# Patient Record
Sex: Female | Born: 1980 | Race: Asian | Hispanic: No | Marital: Married | Smoking: Current every day smoker
Health system: Southern US, Community
[De-identification: ages and names within clinical notes are randomized; demographics above are authoritative.]

## PROBLEM LIST (undated history)

## (undated) DIAGNOSIS — G40909 Epilepsy, unspecified, not intractable, without status epilepticus: Secondary | ICD-10-CM

## (undated) DIAGNOSIS — R569 Unspecified convulsions: Secondary | ICD-10-CM

## (undated) HISTORY — PX: BRAIN BIOPSY: SHX905

---

## 2015-08-19 DIAGNOSIS — L941 Linear scleroderma: Secondary | ICD-10-CM

## 2015-08-19 DIAGNOSIS — G40209 Localization-related (focal) (partial) symptomatic epilepsy and epileptic syndromes with complex partial seizures, not intractable, without status epilepticus: Secondary | ICD-10-CM | POA: Diagnosis present

## 2016-02-01 DIAGNOSIS — D1800 Hemangioma unspecified site: Secondary | ICD-10-CM | POA: Insufficient documentation

## 2019-12-20 ENCOUNTER — Other Ambulatory Visit: Payer: Self-pay

## 2019-12-20 ENCOUNTER — Inpatient Hospital Stay
Admission: EM | Admit: 2019-12-20 | Discharge: 2019-12-25 | DRG: 871 | Disposition: A | Discharge status: Home / Self Care | Payer: 59 | Attending: Internal Medicine | Admitting: Internal Medicine

## 2019-12-20 ENCOUNTER — Emergency Department: Payer: 59

## 2019-12-20 DIAGNOSIS — E669 Obesity, unspecified: Secondary | ICD-10-CM | POA: Diagnosis present

## 2019-12-20 DIAGNOSIS — A4189 Other specified sepsis: Principal | ICD-10-CM | POA: Diagnosis present

## 2019-12-20 DIAGNOSIS — U071 COVID-19: Secondary | ICD-10-CM

## 2019-12-20 DIAGNOSIS — A419 Sepsis, unspecified organism: Secondary | ICD-10-CM | POA: Diagnosis not present

## 2019-12-20 DIAGNOSIS — Z87820 Personal history of traumatic brain injury: Secondary | ICD-10-CM

## 2019-12-20 DIAGNOSIS — Z6831 Body mass index (BMI) 31.0-31.9, adult: Secondary | ICD-10-CM

## 2019-12-20 DIAGNOSIS — G40209 Localization-related (focal) (partial) symptomatic epilepsy and epileptic syndromes with complex partial seizures, not intractable, without status epilepticus: Secondary | ICD-10-CM | POA: Diagnosis present

## 2019-12-20 DIAGNOSIS — J1282 Pneumonia due to coronavirus disease 2019: Secondary | ICD-10-CM | POA: Diagnosis present

## 2019-12-20 DIAGNOSIS — D649 Anemia, unspecified: Secondary | ICD-10-CM | POA: Diagnosis present

## 2019-12-20 DIAGNOSIS — J9601 Acute respiratory failure with hypoxia: Secondary | ICD-10-CM

## 2019-12-20 DIAGNOSIS — E875 Hyperkalemia: Secondary | ICD-10-CM | POA: Diagnosis present

## 2019-12-20 DIAGNOSIS — L941 Linear scleroderma: Secondary | ICD-10-CM

## 2019-12-20 DIAGNOSIS — R1310 Dysphagia, unspecified: Secondary | ICD-10-CM

## 2019-12-20 DIAGNOSIS — J029 Acute pharyngitis, unspecified: Secondary | ICD-10-CM

## 2019-12-20 DIAGNOSIS — Z79899 Other long term (current) drug therapy: Secondary | ICD-10-CM

## 2019-12-20 DIAGNOSIS — E876 Hypokalemia: Secondary | ICD-10-CM | POA: Diagnosis not present

## 2019-12-20 HISTORY — DX: Epilepsy, unspecified, not intractable, without status epilepticus: G40.909

## 2019-12-20 LAB — CBC
HCT: 41.5 % (ref 36.0–46.0)
Hemoglobin: 12.4 g/dL (ref 12.0–15.0)
MCH: 26.7 pg (ref 26.0–34.0)
MCHC: 29.9 g/dL — ABNORMAL LOW (ref 30.0–36.0)
MCV: 89.4 fL (ref 80.0–100.0)
Platelets: 222 10*3/uL (ref 150–400)
RBC: 4.64 MIL/uL (ref 3.87–5.11)
RDW: 16.1 % — ABNORMAL HIGH (ref 11.5–15.5)
WBC: 16.5 10*3/uL — ABNORMAL HIGH (ref 4.0–10.5)
nRBC: 0 % (ref 0.0–0.2)

## 2019-12-20 MED ORDER — SODIUM CHLORIDE 0.9 % IV SOLN
500.0000 mg | Freq: Once | INTRAVENOUS | Status: AC
  Administered 2019-12-21: 01:00:00 500 mg via INTRAVENOUS
  Filled 2019-12-20: qty 500

## 2019-12-20 MED ORDER — SODIUM CHLORIDE 0.9 % IV SOLN
1.0000 g | Freq: Once | INTRAVENOUS | Status: AC
  Administered 2019-12-21: 01:00:00 1 g via INTRAVENOUS
  Filled 2019-12-20: qty 10

## 2019-12-20 MED ORDER — ACETAMINOPHEN 160 MG/5ML PO SOLN
500.0000 mg | Freq: Once | ORAL | Status: AC
  Filled 2019-12-20: qty 20.3

## 2019-12-20 MED ORDER — SODIUM CHLORIDE 0.9 % IV BOLUS
1000.0000 mL | Freq: Once | INTRAVENOUS | Status: AC
  Administered 2019-12-21: 1000 mL via INTRAVENOUS

## 2019-12-20 MED ORDER — ONDANSETRON HCL 4 MG/2ML IJ SOLN
4.0000 mg | Freq: Once | INTRAMUSCULAR | Status: AC
  Administered 2019-12-21: 4 mg via INTRAVENOUS
  Filled 2019-12-20: qty 2

## 2019-12-20 MED ORDER — KETOROLAC TROMETHAMINE 30 MG/ML IJ SOLN
30.0000 mg | Freq: Once | INTRAMUSCULAR | Status: AC
  Administered 2019-12-21: 30 mg via INTRAVENOUS
  Filled 2019-12-20: qty 1

## 2019-12-20 MED ORDER — ACETAMINOPHEN 160 MG/5ML PO SUSP
ORAL | Status: AC
  Administered 2019-12-20: 500 mg via ORAL
  Filled 2019-12-20: qty 20

## 2019-12-20 NOTE — ED Provider Notes (Addendum)
[facility] Emergency Department Provider Note  ____________________________________________   First MD Initiated Contact with Patient 12/20/19 2319     (approximate)  I have reviewed the triage vital signs and the nursing notes.   HISTORY  Chief Complaint Emesis and Sore Throat   HPI Joanna Nelson is a 39 y.o. female with history of TBI presents to the emergency department with a 1 day history of sore throat current pain score 10 out of 10 with associated generalized body aches, nonproductive cough poor p.o. intake subjective fevers       Past Medical History:  Diagnosis Date  . Epilepsy [facility])     Patient Active Problem List   Diagnosis Date Noted  . Pneumonia due to COVID-19 virus 12/21/2019  . Acute respiratory failure with hypoxia ([place]) 12/21/2019  . Acute pharyngitis 12/21/2019  . Sepsis ([place]) 12/21/2019  . Dysphagia 12/21/2019  . 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 Authorizing Provider  cloBAZam (ONFI) 10 MG tablet Take 30 mg by mouth at bedtime. 08/24/19  Yes [provider]  divalproex (DEPAKOTE ER) 500 MG 24 hr tablet Take 500 mg by mouth at bedtime. 11/25/19  Yes [provider]  lamoTRIgine (LAMICTAL) 150 MG tablet Take 150 mg by mouth daily. 12/12/19  Yes [provider]    Allergies Patient has no known allergies.  No family history on file.  Social History Social History   Tobacco Use  . Smoking status: Not on file  Substance Use Topics  . Alcohol use: Not on file  . Drug use: Not on file    Review of Systems Constitutional: No fever/chills Eyes: No visual changes. ENT: Positive for sore throat Cardiovascular: Denies chest pain. Respiratory: Denies shortness of breath.  Positive for cough Gastrointestinal: No abdominal pain.  No  nausea, no vomiting.  No diarrhea.  No constipation. Genitourinary: Negative for dysuria. Musculoskeletal: Negative for neck pain.  Negative for back pain.  Positive for generalized muscle ache Integumentary: Negative for rash. Neurological: Negative for headaches, focal weakness or numbness.   ____________________________________________   PHYSICAL EXAM:  VITAL SIGNS: ED Triage Vitals  Enc Vitals Group     BP 12/20/19 2247 123/75     Pulse Rate 12/20/19 2247 (!) 125     Resp 12/20/19 2247 20     Temp 12/20/19 2247 (!) 103.2 F (39.6 C)     Temp Source 12/20/19 2247 Oral     SpO2 12/20/19 2247 95 %     Weight 12/20/19 2248 72.6 kg (160 lb)     Height 12/20/19 2248 1.575 m (5\' 2" )     Head Circumference --      Peak Flow --      Pain Score --      Pain Loc --      Pain Edu? --      Excl. in [place]? --     Constitutional: Alert and oriented.  Eyes: Conjunctivae are normal.  Head: Atraumatic. Nose: No congestion/rhinnorhea. Mouth/Throat: Patient is wearing a mask. Neck: No stridor.  No meningeal signs.   Cardiovascular: Normal rate, regular rhythm. Good peripheral circulation. Grossly normal heart sounds. Respiratory: Normal respiratory effort.  No retractions. Gastrointestinal: Soft and nontender. No distention.  Musculoskeletal: No lower extremity tenderness nor edema. No gross deformities of extremities. Neurologic:  Normal speech and language. No  gross focal neurologic deficits are appreciated.  Skin:  Skin is warm, dry and intact. Psychiatric: Mood and affect are normal. Speech and behavior are normal.  ____________________________________________   LABS (all labs ordered are listed, but only abnormal results are displayed)  Labs Reviewed  CBC - Abnormal; Notable for the following components:      Result Value   WBC 16.5 (*)    MCHC 29.9 (*)    RDW 16.1 (*)    All other components within normal limits  COMPREHENSIVE METABOLIC PANEL - Abnormal; Notable for the  following components:   Potassium 5.4 (*)    Glucose, Bld 119 (*)    Calcium 8.3 (*)    Total Protein 8.2 (*)    All other components within normal limits  URINALYSIS, COMPLETE (UACMP) WITH MICROSCOPIC - Abnormal; Notable for the following components:   Color, Urine YELLOW (*)    APPearance HAZY (*)    Glucose, UA 50 (*)    Hgb urine dipstick SMALL (*)    Ketones, ur 20 (*)    Bacteria, UA RARE (*)    All other components within normal limits  CBC - Abnormal; Notable for the following components:   Hemoglobin 8.2 (*)    HCT 30.2 (*)    MCV 67.3 (*)    MCH 18.3 (*)    MCHC 27.2 (*)    RDW 19.0 (*)    All other components within normal limits  CBC - Abnormal; Notable for the following components:   RBC 3.85 (*)    Hemoglobin 7.0 (*)    HCT 26.4 (*)    MCV 68.6 (*)    MCH 18.2 (*)    MCHC 26.5 (*)    RDW 19.1 (*)    All other components within normal limits  C-REACTIVE PROTEIN - Abnormal; Notable for the following components:   CRP 6.5 (*)    All other components within normal limits  FOLATE - Abnormal; Notable for the following components:   Folate 5.5 (*)    All other components within normal limits  POC SARS CORONAVIRUS 2 AG - Abnormal; Notable for the following components:   SARS Coronavirus 2 Ag POSITIVE (*)    All other components within normal limits  GROUP A STREP BY PCR  CULTURE, BLOOD (ROUTINE X 2)  CULTURE, BLOOD (ROUTINE X 2)  URINE CULTURE  EXPECTORATED SPUTUM ASSESSMENT W REFEX TO RESP CULTURE  LACTIC ACID, PLASMA  LACTIC ACID, PLASMA  PROTIME-INR  PREGNANCY, URINE  HIV ANTIBODY (ROUTINE TESTING W REFLEX)  PROTIME-INR  CORTISOL-AM, BLOOD  PROCALCITONIN  FIBRIN DERIVATIVES D-DIMER ([facility] ONLY)  IRON AND TIBC  VITAMIN B12  OCCULT BLOOD X 1 CARD TO LAB, STOOL  CBC WITH DIFFERENTIAL/PLATELET  COMPREHENSIVE METABOLIC PANEL  C-REACTIVE PROTEIN  FIBRIN DERIVATIVES D-DIMER ([facility] ONLY)  POC SARS CORONAVIRUS 2 AG -  ED  ABO/RH  TYPE AND SCREEN  PREPARE  RBC (CROSSMATCH)   ED ECG REPORT I, [place] Zilphia Kozinski, the attending physician, personally viewed and interpreted this ECG.   Date: 12/21/2019  EKG Time: 12:28 AM  Rate: 117  Rhythm: Sinus tachycardia  Axis: Normal  Intervals: Normal  ST&T Change: Diffuse T wave inversions   RADIOLOGY I, [place] Tyonna Talerico, personally viewed and evaluated these images (plain radiographs) as part of my medical decision making, as well as reviewing the written report by the radiologist.  ED MD interpretation:    Official radiology report(s): CT Soft Tissue Neck W Contrast  Result Date: 12/21/2019  CLINICAL DATA:  Vomiting and sore throat EXAM: CT NECK WITH CONTRAST TECHNIQUE: Multidetector CT imaging of the neck was performed using the standard protocol following the bolus administration of intravenous contrast. CONTRAST:  21mL OMNIPAQUE IOHEXOL 300 MG/ML  SOLN COMPARISON:  None. FINDINGS: Pharynx and larynx: --Nasopharynx: Fossae of [place] are clear. Normal adenoid tonsils for age. --Oral cavity and oropharynx: No visible lesion of the tongue or floor of mouth. Normal lingual and palatine tonsils. Oropharynx is clear. --Hypopharynx: Normal vallecula and pyriform sinuses. --Larynx: Normal epiglottis and pre-epiglottic space. Normal aryepiglottic and vocal folds. --Retropharyngeal space: No abscess, effusion or lymphadenopathy. Salivary glands: No right parotid gland is visualized. There is compensatory hypertrophy of the left parotid gland. Diminutive right submandibular gland. Thyroid: Normal. Lymph nodes: None enlarged or abnormal density. Vascular: Negative. Limited intracranial: Negative. Visualized orbits: Asymmetric but otherwise normal Mastoids and visualized paranasal sinuses: Clear. Skeleton: No acute or aggressive process. Upper chest: Negative. Other: Facial asymmetry with atrophy of the right 10 paralysis and masseter muscles. IMPRESSION: 1. No acute abnormality of the neck. 2. 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 3. Facial asymmetry with atrophy of the right temporalis and masseter muscles. Diminutive right orbit. Electronically Signed   By: Ulyses Jarred M.D.   On: 12/21/2019 02:15     .Critical Care Performed by: Gregor Hams, MD Authorized by: Gregor Hams, MD   Critical care provider statement:    Critical care time (minutes):  30   Critical care time was exclusive of:  Separately billable procedures and treating other patients   Critical care was necessary to treat or prevent imminent or life-threatening deterioration of the following conditions:  Respiratory failure and sepsis   Critical care was time spent personally by me on the following activities:  Development of treatment plan with patient or surrogate, discussions with consultants, evaluation of patient's response to treatment, examination of patient, obtaining history from patient or surrogate, ordering and performing treatments and interventions, ordering and review of laboratory studies, ordering and review of radiographic studies, pulse oximetry, re-evaluation of patient's condition and review of old charts     ____________________________________________   INITIAL IMPRESSION / MDM / [place] / ED COURSE  As part of my medical decision making, I reviewed the following data within the electronic MEDICAL RECORD NUMBER   38 year old female presented with above-stated history and physical exam differential diagnosis including but not limited to sepsis, COVID-19.  As such sepsis protocol was initiated given concern for COVID-19 pneumonia and possibly superimposed bacterial pneumonia IV ceftriaxone and azithromycin was administered.  Patient noted to be COVID-19 here in the emergency department and as such Decadron and remdesivir ordered.  Patient with hypoxia with very limited activity with oxygen saturations decreasing to 88.  As such patient discussed  with hospitalist after hospital admission for further evaluation and management.      ____________________________________________  FINAL CLINICAL IMPRESSION(S) / ED DIAGNOSES  Final diagnoses:  Sepsis, due to unspecified organism, unspecified whether acute organ dysfunction present ([place])  COVID-19     MEDICATIONS GIVEN DURING THIS VISIT:  Medications  enoxaparin (LOVENOX) injection 40 mg (40 mg Subcutaneous Not Given 12/21/19 0844)  cefTRIAXone (ROCEPHIN) 2 g in sodium chloride 0.9 % 100 mL IVPB (2 g Intravenous New Bag/Given 12/21/19 2109)  azithromycin (ZITHROMAX) 500 mg in sodium chloride 0.9 % 250 mL IVPB (500 mg Intravenous New Bag/Given 12/21/19 2135)  acetaminophen (TYLENOL) tablet 650 mg (has no  administration in time range)    Or  acetaminophen (TYLENOL) suppository 650 mg (has no administration in time range)  ondansetron (ZOFRAN) tablet 4 mg (has no administration in time range)    Or  ondansetron (ZOFRAN) injection 4 mg (has no administration in time range)  remdesivir 200 mg in sodium chloride 0.9% 250 mL IVPB (200 mg Intravenous New Bag/Given 12/21/19 0410)    Followed by  remdesivir 100 mg in sodium chloride 0.9 % 100 mL IVPB (has no administration in time range)  albuterol (VENTOLIN HFA) 108 (90 Base) MCG/ACT inhaler 2 puff (2 puffs Inhalation Given 12/21/19 1925)  dexamethasone (DECADRON) injection 6 mg (6 mg Intravenous Given 12/21/19 1732)  guaiFENesin-dextromethorphan (ROBITUSSIN DM) 100-10 MG/5ML syrup 10 mL (has no administration in time range)  chlorpheniramine-HYDROcodone (TUSSIONEX) 10-8 MG/5ML suspension 5 mL (5 mLs Oral Given 12/21/19 0405)  ascorbic acid (VITAMIN C) tablet 500 mg (500 mg Oral Given 12/21/19 0950)  zinc sulfate capsule 220 mg (220 mg Oral Given 12/21/19 0951)  multivitamin with minerals tablet 1 tablet (1 tablet Oral Given 12/21/19 0950)  menthol-cetylpyridinium (CEPACOL) lozenge 3 mg (has no administration in time range)  phenol (CHLORASEPTIC)  mouth spray 1 spray (1 spray Mouth/Throat Given 12/21/19 1329)  pantoprazole (PROTONIX) injection 40 mg (40 mg Intravenous Given 12/21/19 2108)  0.9 %  sodium chloride infusion ( Intravenous New Bag/Given 12/21/19 1736)  ketorolac (TORADOL) 30 MG/ML injection 30 mg (30 mg Intravenous Given 12/21/19 0049)  sodium chloride 0.9 % bolus 1,000 mL (0 mLs Intravenous Stopped 12/21/19 0257)  sodium chloride 0.9 % bolus 1,000 mL (1,000 mLs Intravenous Transfusing/Transfer 12/21/19 0341)  ondansetron (ZOFRAN) injection 4 mg (4 mg Intravenous Given 12/21/19 0049)  acetaminophen (TYLENOL) 160 MG/5ML solution 500 mg (500 mg Oral Given 12/20/19 2356)  cefTRIAXone (ROCEPHIN) 1 g in sodium chloride 0.9 % 100 mL IVPB (0 g Intravenous Stopped 12/21/19 0222)  azithromycin (ZITHROMAX) 500 mg in sodium chloride 0.9 % 250 mL IVPB (0 mg Intravenous Stopped 12/21/19 0222)  dexamethasone (DECADRON) injection 10 mg (10 mg Intravenous Given 12/21/19 0118)  iohexol (OMNIPAQUE) 300 MG/ML solution 75 mL (75 mLs Intravenous Contrast Given 12/21/19 0151)  0.9 %  sodium chloride infusion (Manually program via Guardrails IV Fluids) ( Intravenous New Bag/Given 12/21/19 0851)     ED Discharge Orders    None      *Please note:  Joanna Nelson was evaluated in Emergency Department on 12/22/2019 for the symptoms described in the history of present illness. She was evaluated in the context of the global COVID-19 pandemic, which necessitated consideration that the patient might be at risk for infection with the SARS-CoV-2 virus that causes COVID-19. Institutional protocols and algorithms that pertain to the evaluation of patients at risk for COVID-19 are in a state of rapid change based on information released by regulatory bodies including the CDC and federal and state organizations. These policies and algorithms were followed during the patient's care in the ED.  Some ED evaluations and interventions may be delayed as a result of limited staffing  during the pandemic.*  Note:  This document was prepared using Dragon voice recognition software and may include unintentional dictation errors.   Gregor Hams, MD 12/22/19 [id number]    Gregor Hams, MD 01/02/20 2236

## 2019-12-20 NOTE — ED Triage Notes (Signed)
Patient is non verbal.  Patient wrote note saying unable to eat for 2 days due to vomiting and having sore throat.  Patient with very congested cough noted during triage.

## 2019-12-21 ENCOUNTER — Emergency Department: Payer: 59

## 2019-12-21 ENCOUNTER — Encounter: Payer: Self-pay | Admitting: Radiology

## 2019-12-21 DIAGNOSIS — D649 Anemia, unspecified: Secondary | ICD-10-CM | POA: Diagnosis present

## 2019-12-21 DIAGNOSIS — J1282 Pneumonia due to coronavirus disease 2019: Secondary | ICD-10-CM | POA: Diagnosis present

## 2019-12-21 DIAGNOSIS — D509 Iron deficiency anemia, unspecified: Secondary | ICD-10-CM | POA: Diagnosis not present

## 2019-12-21 DIAGNOSIS — Z87820 Personal history of traumatic brain injury: Secondary | ICD-10-CM | POA: Diagnosis not present

## 2019-12-21 DIAGNOSIS — A419 Sepsis, unspecified organism: Secondary | ICD-10-CM

## 2019-12-21 DIAGNOSIS — Z6831 Body mass index (BMI) 31.0-31.9, adult: Secondary | ICD-10-CM | POA: Diagnosis not present

## 2019-12-21 DIAGNOSIS — E875 Hyperkalemia: Secondary | ICD-10-CM | POA: Diagnosis present

## 2019-12-21 DIAGNOSIS — J029 Acute pharyngitis, unspecified: Secondary | ICD-10-CM | POA: Diagnosis present

## 2019-12-21 DIAGNOSIS — Z79899 Other long term (current) drug therapy: Secondary | ICD-10-CM | POA: Diagnosis not present

## 2019-12-21 DIAGNOSIS — E876 Hypokalemia: Secondary | ICD-10-CM | POA: Diagnosis not present

## 2019-12-21 DIAGNOSIS — R1310 Dysphagia, unspecified: Secondary | ICD-10-CM | POA: Diagnosis present

## 2019-12-21 DIAGNOSIS — E669 Obesity, unspecified: Secondary | ICD-10-CM | POA: Diagnosis present

## 2019-12-21 DIAGNOSIS — G40209 Localization-related (focal) (partial) symptomatic epilepsy and epileptic syndromes with complex partial seizures, not intractable, without status epilepticus: Secondary | ICD-10-CM | POA: Diagnosis present

## 2019-12-21 DIAGNOSIS — R6520 Severe sepsis without septic shock: Secondary | ICD-10-CM | POA: Diagnosis not present

## 2019-12-21 DIAGNOSIS — A4189 Other specified sepsis: Secondary | ICD-10-CM | POA: Diagnosis present

## 2019-12-21 DIAGNOSIS — J9601 Acute respiratory failure with hypoxia: Secondary | ICD-10-CM | POA: Diagnosis present

## 2019-12-21 DIAGNOSIS — U071 COVID-19: Secondary | ICD-10-CM | POA: Diagnosis present

## 2019-12-21 LAB — URINALYSIS, COMPLETE (UACMP) WITH MICROSCOPIC
Bilirubin Urine: NEGATIVE
Glucose, UA: 50 mg/dL — AB
Ketones, ur: 20 mg/dL — AB
Leukocytes,Ua: NEGATIVE
Nitrite: NEGATIVE
Protein, ur: NEGATIVE mg/dL
Specific Gravity, Urine: 1.013 (ref 1.005–1.030)
pH: 7 (ref 5.0–8.0)

## 2019-12-21 LAB — CBC
HCT: 30.2 % — ABNORMAL LOW (ref 36.0–46.0)
HCT: 26.4 % — ABNORMAL LOW (ref 36.0–46.0)
Hemoglobin: 8.2 g/dL — ABNORMAL LOW (ref 12.0–15.0)
Hemoglobin: 7 g/dL — ABNORMAL LOW (ref 12.0–15.0)
MCH: 18.3 pg — ABNORMAL LOW (ref 26.0–34.0)
MCH: 18.2 pg — ABNORMAL LOW (ref 26.0–34.0)
MCHC: 27.2 g/dL — ABNORMAL LOW (ref 30.0–36.0)
MCHC: 26.5 g/dL — ABNORMAL LOW (ref 30.0–36.0)
MCV: 67.3 fL — ABNORMAL LOW (ref 80.0–100.0)
MCV: 68.6 fL — ABNORMAL LOW (ref 80.0–100.0)
Platelets: 359 10*3/uL (ref 150–400)
Platelets: 310 10*3/uL (ref 150–400)
RBC: 4.49 MIL/uL (ref 3.87–5.11)
RBC: 3.85 MIL/uL — ABNORMAL LOW (ref 3.87–5.11)
RDW: 19 % — ABNORMAL HIGH (ref 11.5–15.5)
RDW: 19.1 % — ABNORMAL HIGH (ref 11.5–15.5)
WBC: 8.8 10*3/uL (ref 4.0–10.5)
WBC: 8.3 10*3/uL (ref 4.0–10.5)
nRBC: 0 % (ref 0.0–0.2)
nRBC: 0 % (ref 0.0–0.2)

## 2019-12-21 LAB — IRON AND TIBC
Iron: 82 ug/dL (ref 28–170)
Saturation Ratios: 20 % (ref 10.4–31.8)
TIBC: 420 ug/dL (ref 250–450)
UIBC: 338 ug/dL

## 2019-12-21 LAB — COMPREHENSIVE METABOLIC PANEL
ALT: 8 U/L (ref 0–44)
AST: 24 U/L (ref 15–41)
Albumin: 3.5 g/dL (ref 3.5–5.0)
Alkaline Phosphatase: 106 U/L (ref 38–126)
Anion gap: 15 (ref 5–15)
BUN: 7 mg/dL (ref 6–20)
CO2: 23 mmol/L (ref 22–32)
Calcium: 8.3 mg/dL — ABNORMAL LOW (ref 8.9–10.3)
Chloride: 99 mmol/L (ref 98–111)
Creatinine, Ser: 0.87 mg/dL (ref 0.44–1.00)
GFR calc Af Amer: >60 mL/min (ref >60)
GFR calc non Af Amer: >60 mL/min (ref >60)
Glucose, Bld: 119 mg/dL — ABNORMAL HIGH (ref 70–99)
Potassium: 5.4 mmol/L — ABNORMAL HIGH (ref 3.5–5.1)
Sodium: 137 mmol/L (ref 135–145)
Total Bilirubin: 0.9 mg/dL (ref 0.3–1.2)
Total Protein: 8.2 g/dL — ABNORMAL HIGH (ref 6.5–8.1)

## 2019-12-21 LAB — HIV ANTIBODY (ROUTINE TESTING W REFLEX): HIV Screen 4th Generation wRfx: NONREACTIVE

## 2019-12-21 LAB — POC SARS CORONAVIRUS 2 AG: SARS Coronavirus 2 Ag: POSITIVE — AB

## 2019-12-21 LAB — VITAMIN B12: Vitamin B-12: 399 pg/mL (ref 180–914)

## 2019-12-21 LAB — PROTIME-INR
INR: 1 (ref 0.8–1.2)
INR: 1.1 (ref 0.8–1.2)
Prothrombin Time: 13.1 seconds (ref 11.4–15.2)
Prothrombin Time: 14.5 seconds (ref 11.4–15.2)

## 2019-12-21 LAB — ABO/RH: ABO/RH(D): O POS

## 2019-12-21 LAB — GROUP A STREP BY PCR: Group A Strep by PCR: NOT DETECTED

## 2019-12-21 LAB — LACTIC ACID, PLASMA
Lactic Acid, Venous: 1.8 mmol/L (ref 0.5–1.9)
Lactic Acid, Venous: 1.4 mmol/L (ref 0.5–1.9)

## 2019-12-21 LAB — FOLATE: Folate: 5.5 ng/mL — ABNORMAL LOW (ref >5.9)

## 2019-12-21 LAB — PREPARE RBC (CROSSMATCH)

## 2019-12-21 LAB — PROCALCITONIN: Procalcitonin: 0.1 ng/mL

## 2019-12-21 LAB — FIBRIN DERIVATIVES D-DIMER (ARMC ONLY): Fibrin derivatives D-dimer (ARMC): 347.73 ng/mL (FEU) (ref 0.00–499.00)

## 2019-12-21 LAB — CORTISOL-AM, BLOOD: Cortisol - AM: 7.2 ug/dL (ref 6.7–22.6)

## 2019-12-21 LAB — C-REACTIVE PROTEIN: CRP: 6.5 mg/dL — ABNORMAL HIGH (ref <1.0)

## 2019-12-21 LAB — PREGNANCY, URINE: Preg Test, Ur: NEGATIVE

## 2019-12-21 MED ORDER — DEXAMETHASONE SODIUM PHOSPHATE 10 MG/ML IJ SOLN
10.0000 mg | Freq: Once | INTRAMUSCULAR | Status: AC
  Administered 2019-12-21: 10 mg via INTRAVENOUS

## 2019-12-21 MED ORDER — SODIUM CHLORIDE 0.9 % IV SOLN
2.0000 g | INTRAVENOUS | Status: DC
  Administered 2019-12-21: 21:00:00 2 g via INTRAVENOUS
  Filled 2019-12-21: qty 2
  Filled 2019-12-21: qty 20

## 2019-12-21 MED ORDER — PANTOPRAZOLE SODIUM 40 MG IV SOLR
40.0000 mg | Freq: Two times a day (BID) | INTRAVENOUS | Status: DC
  Administered 2019-12-21 – 2019-12-25 (×9): 40 mg via INTRAVENOUS
  Filled 2019-12-21 (×9): qty 40

## 2019-12-21 MED ORDER — ACETAMINOPHEN 325 MG PO TABS: 650.0000 mg | ORAL_TABLET | Freq: Four times a day (QID) | ORAL | Status: DC | PRN

## 2019-12-21 MED ORDER — LACTATED RINGERS IV BOLUS (SEPSIS): 1000.0000 mL | Freq: Once | INTRAVENOUS | Status: DC

## 2019-12-21 MED ORDER — ONDANSETRON HCL 4 MG PO TABS: 4.0000 mg | ORAL_TABLET | Freq: Four times a day (QID) | ORAL | Status: DC | PRN

## 2019-12-21 MED ORDER — SODIUM CHLORIDE 0.9% IV SOLUTION: Freq: Once | INTRAVENOUS | Status: AC

## 2019-12-21 MED ORDER — ADULT MULTIVITAMIN W/MINERALS CH
1.0000 | ORAL_TABLET | Freq: Every day | ORAL | Status: DC
  Administered 2019-12-21 – 2019-12-25 (×5): 1 via ORAL
  Filled 2019-12-21 (×5): qty 1

## 2019-12-21 MED ORDER — ASCORBIC ACID 500 MG PO TABS
500.0000 mg | ORAL_TABLET | Freq: Every day | ORAL | Status: DC
  Administered 2019-12-21 – 2019-12-25 (×5): 500 mg via ORAL
  Filled 2019-12-21 (×5): qty 1

## 2019-12-21 MED ORDER — MENTHOL 3 MG MT LOZG
1.0000 | LOZENGE | OROMUCOSAL | Status: DC | PRN
  Administered 2019-12-22: 3 mg via ORAL
  Filled 2019-12-21 (×2): qty 9

## 2019-12-21 MED ORDER — ONDANSETRON HCL 4 MG/2ML IJ SOLN: 4.0000 mg | Freq: Four times a day (QID) | INTRAMUSCULAR | Status: DC | PRN

## 2019-12-21 MED ORDER — SODIUM CHLORIDE 0.9 % IV SOLN
200.0000 mg | Freq: Once | INTRAVENOUS | Status: AC
  Administered 2019-12-21: 200 mg via INTRAVENOUS
  Filled 2019-12-21: qty 200

## 2019-12-21 MED ORDER — IOHEXOL 300 MG/ML  SOLN
75.0000 mL | Freq: Once | INTRAMUSCULAR | Status: AC | PRN
  Administered 2019-12-21: 75 mL via INTRAVENOUS

## 2019-12-21 MED ORDER — HYDROCOD POLST-CPM POLST ER 10-8 MG/5ML PO SUER
5.0000 mL | Freq: Two times a day (BID) | ORAL | Status: DC | PRN
  Administered 2019-12-21 – 2019-12-25 (×4): 5 mL via ORAL
  Filled 2019-12-21 (×5): qty 5

## 2019-12-21 MED ORDER — SODIUM CHLORIDE 0.9 % IV SOLN
100.0000 mg | Freq: Every day | INTRAVENOUS | Status: AC
  Administered 2019-12-22 – 2019-12-25 (×4): 100 mg via INTRAVENOUS
  Filled 2019-12-21 (×4): qty 20

## 2019-12-21 MED ORDER — PHENOL 1.4 % MT LIQD
1.0000 | OROMUCOSAL | Status: DC | PRN
  Administered 2019-12-21: 1 via OROMUCOSAL
  Filled 2019-12-21 (×2): qty 177

## 2019-12-21 MED ORDER — DEXAMETHASONE SODIUM PHOSPHATE 10 MG/ML IJ SOLN
INTRAMUSCULAR | Status: AC
  Filled 2019-12-21: qty 1

## 2019-12-21 MED ORDER — DEXAMETHASONE SODIUM PHOSPHATE 10 MG/ML IJ SOLN
6.0000 mg | INTRAMUSCULAR | Status: DC
  Administered 2019-12-21 – 2019-12-24 (×4): 6 mg via INTRAVENOUS
  Filled 2019-12-21 (×4): qty 1

## 2019-12-21 MED ORDER — ZINC SULFATE 220 (50 ZN) MG PO CAPS
220.0000 mg | ORAL_CAPSULE | Freq: Every day | ORAL | Status: DC
  Administered 2019-12-21 – 2019-12-25 (×5): 220 mg via ORAL
  Filled 2019-12-21 (×5): qty 1

## 2019-12-21 MED ORDER — SODIUM CHLORIDE 0.9 % IV SOLN: INTRAVENOUS | Status: AC

## 2019-12-21 MED ORDER — ALBUTEROL SULFATE HFA 108 (90 BASE) MCG/ACT IN AERS
2.0000 | INHALATION_SPRAY | Freq: Four times a day (QID) | RESPIRATORY_TRACT | Status: DC
  Administered 2019-12-21 – 2019-12-25 (×19): 2 via RESPIRATORY_TRACT
  Filled 2019-12-21: qty 6.7

## 2019-12-21 MED ORDER — ENOXAPARIN SODIUM 40 MG/0.4ML ~~LOC~~ SOLN
40.0000 mg | SUBCUTANEOUS | Status: DC
  Administered 2019-12-22 – 2019-12-25 (×4): 40 mg via SUBCUTANEOUS
  Filled 2019-12-21 (×5): qty 0.4

## 2019-12-21 MED ORDER — LACTATED RINGERS IV BOLUS (SEPSIS): 250.0000 mL | Freq: Once | INTRAVENOUS | Status: DC

## 2019-12-21 MED ORDER — GUAIFENESIN-DM 100-10 MG/5ML PO SYRP
10.0000 mL | ORAL_SOLUTION | ORAL | Status: DC | PRN
  Administered 2019-12-24: 10 mL via ORAL
  Filled 2019-12-21 (×2): qty 10

## 2019-12-21 MED ORDER — ACETAMINOPHEN 650 MG RE SUPP: 650.0000 mg | Freq: Four times a day (QID) | RECTAL | Status: DC | PRN

## 2019-12-21 MED ORDER — SODIUM CHLORIDE 0.9 % IV SOLN
500.0000 mg | INTRAVENOUS | Status: DC
  Administered 2019-12-21: 500 mg via INTRAVENOUS
  Filled 2019-12-21 (×2): qty 500

## 2019-12-21 NOTE — ED Notes (Signed)
ED TO INPATIENT HANDOFF REPORT  ED Nurse Name and Phone #: Balinda Quails Name/Age/Gender Joanna Nelson 39 y.o. female Room/Bed: ED38A/ED38A  Code Status   Code Status: Full Code  Home/SNF/Other Home Patient oriented to: self, place, time and situation Is this baseline? Yes   Triage Complete: Triage complete  Chief Complaint Sepsis [facility] [A41.9]  Triage Note Patient is non verbal.  Patient wrote note saying unable to eat for 2 days due to vomiting and having sore throat.  Patient with very congested cough noted during triage.    Allergies No Known Allergies  Level of Care/Admitting Diagnosis ED Disposition    ED Disposition Condition Comment   Admit  Hospital Area: [place] [[id number]]  Level of Care: Med-Surg [16]  Covid Evaluation: Confirmed COVID Positive  [facility]osis: [facility]is [id number]  Admitting Physician: Athena Masse [id number]  Attending Physician: Athena Masse [id number]  Estimated length of stay: 3 - 4 days  Certification:: I certify this patient will need inpatient services for at least 2 midnights       B Medical/Surgery History Past Medical History:  Diagnosis Date  . Epilepsy ([place])       A IV Location/Drains/Wounds Patient Lines/Drains/Airways Status   Active Line/Drains/Airways    Name:   Placement date:   Placement time:   Site:   Days:   Peripheral IV 12/21/19 Left Antecubital   12/21/19    0044    Antecubital   less than 1   Peripheral IV 12/21/19 Right Wrist   12/21/19    0103    Wrist   less than 1          Intake/Output Last 24 hours No intake or output data in the 24 hours ending 12/21/19 0330  Labs/Imaging Results for orders placed or performed during the hospital encounter of 12/20/19 (from the past 48 hour(s))  CBC     Status: Abnormal   Collection Time: 12/20/19  8:42 PM  Result Value Ref Range   WBC 16.5 (H) 4.0 - 10.5 K/uL   RBC 4.64 3.87 - 5.11 MIL/uL   Hemoglobin 12.4 12.0 - 15.0 g/dL   HCT  41.5 36.0 - 46.0 %   MCV 89.4 80.0 - 100.0 fL   MCH 26.7 26.0 - 34.0 pg   MCHC 29.9 (L) 30.0 - 36.0 g/dL   RDW 16.1 (H) 11.5 - 15.5 %   Platelets 222 150 - 400 K/uL   nRBC 0.0 0.0 - 0.2 %    Comment: Performed at [facility], [address] [place], [place] [address]  Comprehensive metabolic panel     Status: Abnormal   Collection Time: 12/20/19  8:42 PM  Result Value Ref Range   Sodium 137 135 - 145 mmol/L   Potassium 5.4 (H) 3.5 - 5.1 mmol/L   Chloride 99 98 - 111 mmol/L   CO2 23 22 - 32 mmol/L   Glucose, Bld 119 (H) 70 - 99 mg/dL   BUN 7 6 - 20 mg/dL   Creatinine, Ser 0.87 0.44 - 1.00 mg/dL   Calcium 8.3 (L) 8.9 - 10.3 mg/dL   Total Protein 8.2 (H) 6.5 - 8.1 g/dL   Albumin 3.5 3.5 - 5.0 g/dL   AST 24 15 - 41 U/L   ALT 8 0 - 44 U/L   Alkaline Phosphatase 106 38 - 126 U/L   Total Bilirubin 0.9 0.3 - 1.2 mg/dL   GFR calc non Af Amer >60 >60 mL/min  GFR calc Af Amer >60 >60 mL/min   Anion gap 15 5 - 15    Comment: Performed at [facility], [address] [place], [place] [address]  CBC     Status: Abnormal   Collection Time: 12/20/19  8:42 PM  Result Value Ref Range   WBC 8.8 4.0 - 10.5 K/uL   RBC 4.49 3.87 - 5.11 MIL/uL   Hemoglobin 8.2 (L) 12.0 - 15.0 g/dL    Comment: Reticulocyte Hemoglobin testing may be clinically indicated, consider ordering this additional test [id number]    HCT 30.2 (L) 36.0 - 46.0 %   MCV 67.3 (L) 80.0 - 100.0 fL    Comment: REPEATED TO VERIFY   MCH 18.3 (L) 26.0 - 34.0 pg   MCHC 27.2 (L) 30.0 - 36.0 g/dL   RDW 19.0 (H) 11.5 - 15.5 %   Platelets 359 150 - 400 K/uL   nRBC 0.0 0.0 - 0.2 %    Comment: Performed at [facility], [address], [place], [place] [address]  ABO/Rh     Status: None   Collection Time: 12/20/19  8:42 PM  Result Value Ref Range   ABO/RH(D)      O POS Performed at [facility], [address] [place], [place] [address]   Protime-INR     Status: None   Collection Time:  12/20/19 10:48 PM  Result Value Ref Range   Prothrombin Time 13.1 11.4 - 15.2 seconds   INR 1.0 0.8 - 1.2    Comment: (NOTE) INR goal varies based on device and disease states. Performed at [facility], [place], [place] [address]   Group A Strep by PCR [facility] Only)     Status: None   Collection Time: 12/20/19 11:52 PM   Specimen: Throat; Sterile Swab  Result Value Ref Range   Group A Strep by PCR NOT DETECTED NOT DETECTED    Comment: Performed at [facility], [address] [place], [place] [address]  Urinalysis, Complete w Microscopic     Status: Abnormal   Collection Time: 12/21/19 12:36 AM  Result Value Ref Range   Color, Urine YELLOW (A) YELLOW   APPearance HAZY (A) CLEAR   Specific Gravity, Urine 1.013 1.005 - 1.030   pH 7.0 5.0 - 8.0   Glucose, UA 50 (A) NEGATIVE mg/dL   Hgb urine dipstick SMALL (A) NEGATIVE   Bilirubin Urine NEGATIVE NEGATIVE   Ketones, ur 20 (A) NEGATIVE mg/dL   Protein, ur NEGATIVE NEGATIVE mg/dL   Nitrite NEGATIVE NEGATIVE   Leukocytes,Ua NEGATIVE NEGATIVE   RBC / HPF 0-5 0 - 5 RBC/hpf   WBC, UA 0-5 0 - 5 WBC/hpf   Bacteria, UA RARE (A) NONE SEEN   Squamous Epithelial / LPF 6-10 0 - 5   Mucus PRESENT     Comment: Performed at [facility], [address] [place], [place] [address]  Lactic acid, plasma     Status: None   Collection Time: 12/21/19 12:36 AM  Result Value Ref Range   Lactic Acid, Venous 1.8 0.5 - 1.9 mmol/L    Comment: Performed at [facility], [address] [place], [place] [address]  Pregnancy, urine     Status: None   Collection Time: 12/21/19 12:36 AM  Result Value Ref Range   Preg Test, Ur NEGATIVE NEGATIVE    Comment: Performed at [facility], [address], [place], [place] [address]  POC SARS Coronavirus 2  Ag     Status: Abnormal   Collection Time: 12/21/19  2:40 AM  Result Value Ref Range   SARS Coronavirus 2 Ag POSITIVE (A) NEGATIVE    Comment:  (NOTE) SARS-CoV-2 antigen PRESENT. Positive results indicate the presence of viral antigens, but clinical correlation with patient history and other diagnostic information is necessary to determine patient infection status.  Positive results do not rule out bacterial infection or co-infection  with other viruses. False positive results are rare but can occur, and confirmatory RT-PCR testing may be appropriate in some circumstances. The expected result is Negative. Fact Sheet for Patients: PodPark.tn Fact Sheet for Providers: GiftContent.is  This test is not yet approved or cleared by the [place] FDA and  has been authorized for detection and/or diagnosis of SARS-CoV-2 by FDA under an Emergency Use Authorization (EUA).  This EUA will remain in effect (meaning this test can be used) for the duration of  the COVID-19 declaration under Section 564(b)(1) of the Act, 21 U.S.C. section 360bbb-3(b)(1), unless the a uthorization is terminated or revoked sooner.    CT Soft Tissue Neck W Contrast  Result Date: 12/21/2019 CLINICAL DATA:  Vomiting and sore throat EXAM: CT NECK WITH CONTRAST TECHNIQUE: Multidetector CT imaging of the neck was performed using the standard protocol following the bolus administration of intravenous contrast. CONTRAST:  60mL OMNIPAQUE IOHEXOL 300 MG/ML  SOLN COMPARISON:  None. FINDINGS: Pharynx and larynx: --Nasopharynx: Fossae of Rosenmuller are clear. Normal adenoid tonsils for age. --Oral cavity and oropharynx: No visible lesion of the tongue or floor of mouth. Normal lingual and palatine tonsils. Oropharynx is clear. --Hypopharynx: Normal vallecula and pyriform sinuses. --Larynx: Normal epiglottis and pre-epiglottic space. Normal aryepiglottic and vocal folds. --Retropharyngeal space: No abscess, effusion or lymphadenopathy. Salivary glands: No right parotid gland is visualized. There is compensatory hypertrophy  of the left parotid gland. Diminutive right submandibular gland. Thyroid: Normal. Lymph nodes: None enlarged or abnormal density. Vascular: Negative. Limited intracranial: Negative. Visualized orbits: Asymmetric but otherwise normal Mastoids and visualized paranasal sinuses: Clear. Skeleton: No acute or aggressive process. Upper chest: Negative. Other: Facial asymmetry with atrophy of the right 10 paralysis and masseter muscles. IMPRESSION: 1. No acute abnormality of the neck. 2. 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 3. Facial asymmetry with atrophy of the right temporalis and masseter muscles. Diminutive right orbit. Electronically Signed   By: Ulyses Jarred M.D.   On: 12/21/2019 02:15   DG Chest Portable 1 View  Result Date: 12/20/2019 CLINICAL DATA:  Cough EXAM: PORTABLE CHEST 1 VIEW COMPARISON:  None. FINDINGS: There are subtle bilateral airspace opacities, for example in the left mid lung zone. There is no pneumothorax. No large pleural effusion. No acute osseous abnormality. The heart size is normal. IMPRESSION: Subtle airspace opacities as above, which may represent atelectasis or an atypical infectious process in the appropriate clinical setting. Electronically Signed   By: Constance Holster M.D.   On: 12/20/2019 23:45    Pending Labs Unresulted Labs (From admission, onward)    Start     Ordered   12/28/19 0500  Creatinine, serum  (enoxaparin (LOVENOX)    CrCl >/= 30 ml/min)  Weekly,   STAT    Comments: while on enoxaparin therapy    12/21/19 0142   12/22/19 0500  CBC with Differential/Platelet  Daily,   STAT     12/21/19 0142   12/22/19 0500  Comprehensive metabolic panel  Daily,   STAT  12/21/19 0142   12/22/19 0500  C-reactive protein  Daily,   STAT     12/21/19 0142   12/21/19 0500  Protime-INR  Tomorrow morning,   STAT     12/21/19 0142   12/21/19 0500  Cortisol-am, blood  Tomorrow morning,   STAT     12/21/19 0142    12/21/19 0500  Procalcitonin  Tomorrow morning,   STAT     12/21/19 0142   12/21/19 0137  Culture, sputum-assessment  Once,   STAT     12/21/19 0142   12/21/19 0136  CBC  (enoxaparin (LOVENOX)    CrCl >/= 30 ml/min)  Once,   STAT    Comments: Baseline for enoxaparin therapy IF NOT ALREADY DRAWN.  Notify MD if PLT < 100 K.    12/21/19 0142   12/21/19 0135  HIV Antibody (routine testing w rflx)  (HIV Antibody (Routine testing w reflex) panel)  Once,   STAT     12/21/19 0142   12/20/19 2356  Lactic acid, plasma  Now then every 2 hours,   STAT     12/20/19 2356   12/20/19 2356  Blood Culture (routine x 2)  BLOOD CULTURE X 2,   STAT     12/20/19 2356   12/20/19 2356  Urine culture  ONCE - STAT,   STAT     12/20/19 2356          Vitals/Pain Today's Vitals   12/21/19 0100 12/21/19 0115 12/21/19 0130 12/21/19 0326  BP: 117/73 117/72 109/76 109/65  Pulse: (!) 111 (!) 114 (!) 110 85  Resp: (!) 24 (!) 29 16 16   Temp:    99.7 F (37.6 C)  TempSrc:    Oral  SpO2: 93% 96% 94% 94%  Weight:      Height:      PainSc:    0-No pain    Isolation Precautions Airborne and Contact precautions  Medications Medications  enoxaparin (LOVENOX) injection 40 mg (has no administration in time range)  cefTRIAXone (ROCEPHIN) 2 g in sodium chloride 0.9 % 100 mL IVPB (has no administration in time range)  azithromycin (ZITHROMAX) 500 mg in sodium chloride 0.9 % 250 mL IVPB (has no administration in time range)  acetaminophen (TYLENOL) tablet 650 mg (has no administration in time range)    Or  acetaminophen (TYLENOL) suppository 650 mg (has no administration in time range)  ondansetron (ZOFRAN) tablet 4 mg (has no administration in time range)    Or  ondansetron (ZOFRAN) injection 4 mg (has no administration in time range)  remdesivir 200 mg in sodium chloride 0.9% 250 mL IVPB (has no administration in time range)    Followed by  remdesivir 100 mg in sodium chloride 0.9 % 100 mL IVPB (has no  administration in time range)  albuterol (VENTOLIN HFA) 108 (90 Base) MCG/ACT inhaler 2 puff (has no administration in time range)  dexamethasone (DECADRON) injection 6 mg (has no administration in time range)  guaiFENesin-dextromethorphan (ROBITUSSIN DM) 100-10 MG/5ML syrup 10 mL (has no administration in time range)  chlorpheniramine-HYDROcodone (TUSSIONEX) 10-8 MG/5ML suspension 5 mL (has no administration in time range)  ascorbic acid (VITAMIN C) tablet 500 mg (has no administration in time range)  zinc sulfate capsule 220 mg (has no administration in time range)  multivitamin with minerals tablet 1 tablet (has no administration in time range)  ketorolac (TORADOL) 30 MG/ML injection 30 mg (30 mg Intravenous Given 12/21/19 0049)  sodium chloride 0.9 % bolus 1,000 mL (  0 mLs Intravenous Stopped 12/21/19 0257)  sodium chloride 0.9 % bolus 1,000 mL (1,000 mLs Intravenous New Bag/Given 12/21/19 0104)  ondansetron (ZOFRAN) injection 4 mg (4 mg Intravenous Given 12/21/19 0049)  acetaminophen (TYLENOL) 160 MG/5ML solution 500 mg (500 mg Oral Given 12/20/19 2356)  cefTRIAXone (ROCEPHIN) 1 g in sodium chloride 0.9 % 100 mL IVPB (0 g Intravenous Stopped 12/21/19 0222)  azithromycin (ZITHROMAX) 500 mg in sodium chloride 0.9 % 250 mL IVPB (0 mg Intravenous Stopped 12/21/19 0222)  dexamethasone (DECADRON) injection 10 mg (10 mg Intravenous Given 12/21/19 0118)  iohexol (OMNIPAQUE) 300 MG/ML solution 75 mL (75 mLs Intravenous Contrast Given 12/21/19 0151)    Mobility walks with person assist Low fall risk   Focused Assessments Pulmonary Assessment Handoff:  Lung sounds:   O2 Device: Room Air        R Recommendations: See Admitting Provider Note  Report given to:   Additional Notes: Covid +

## 2019-12-21 NOTE — Progress Notes (Signed)
2nd unit pRBCs received. Patient vital are stable. Transfusion started. No signs of reactions.

## 2019-12-21 NOTE — Progress Notes (Signed)
PROGRESS NOTE                                                                                                                                                                                                             Patient Demographics:    Joanna Nelson, is a 39 y.o. female, DOB - 1981/10/06, [id number]  Admit date - 12/20/2019   Admitting Physician Athena Masse, MD  Outpatient Primary MD for the patient is Patient, No Pcp Per  LOS - 0    Chief Complaint  Patient presents with  . Emesis  . Sore Throat       Brief Narrative   39 year old female with complex partial seizures, progressive right hemifacial atrophy/linear scleroderma, right temporal lobe cavernoma presented to the ED with 2-day history of sore throat with difficulty swallowing both solid and liquid also with cough and fever.  Patient has difficulty speaking due to severe sore throat.  In the ED she was septic with fever of 103 F, tachycardic, hypoxic with drop in O2 sat to 88%, leukocytosis with WBC of 16 K hemoglobin of 8.2 and lactic acid of 1.8.  Rapid flu test was positive. Chest x-ray showing airspace opacity bilaterally.  CT of the soft tissue neck was negative for any soft tissue swelling, obstruction or abscess. Admitted for sepsis due to COVID-19 and possible pneumonia.   Subjective:    Patient unable to speak properly.  Communicates in writing.  Denies any pain but reports irritation to her throat.   Assessment  & Plan :    Principal Problem:  Severe sepsis ([place]) Acute respiratory failure with hypoxia secondary to COVID-19 infection Airborne and contact precautions.  2 L O2 via nasal cannula.  Empiric IV Decadron and remdesivir.  Added vitamin C and zinc.  Albuterol inhaler and antitussives. Empiric Rocephin and azithromycin for sepsis and possible superimposed pneumonia.  Follow blood cultures.  UA negative for infection.  IV  fluids.  Active problems ?  Acute pharyngitis with dysphagia Difficulty with swallowing.  CT of the soft tissue neck without any abscess or obstruction.  Clears for now.  Rapid strep negative.  Ordered lozenges and spray.  Swallow eval.  Acute versus chronic microcytic anemia. Significant drop in H&H to 7 from twelve 1 day back.  No prior blood work in the system to confirm if  this is acute versus chronic.  Check stool for Hemoccult.  Placed on IV Protonix every 12 hours.  Denies any recent EGD or colonoscopy.  Avoid NSAIDs.  Will transfuse 2 unit PRBC.  Check iron panel, B12 and folate. Monitor serial H&H.  Complex partial seizures Continue Depakote and Lamictal  Right facial atrophy/linear scleroderma Outpatient follow-up with neurology.  Hyperkalemia Recheck lab in a.m.    Code Status : Full code  Family Communication  : None  Disposition Plan  : Home pending clinical improvement  Barriers For Discharge : Active symptoms (hypoxia, anemia, dysphagia)  Consults  : None  Procedures  : CT soft tissue neck  DVT Prophylaxis  : Subcu Lovenox  Lab Results  Component Value Date   PLT 310 12/21/2019    Antibiotics  :  Anti-infectives (From admission, onward)   Start     Dose/Rate Route Frequency Ordered Stop   12/22/19 1000  remdesivir 100 mg in sodium chloride 0.9 % 100 mL IVPB     100 mg 200 mL/hr over 30 Minutes Intravenous Daily 12/21/19 0142 12/26/19 0959   12/21/19 2230  azithromycin (ZITHROMAX) 500 mg in sodium chloride 0.9 % 250 mL IVPB     500 mg 250 mL/hr over 60 Minutes Intravenous Every 24 hours 12/21/19 0142     12/21/19 2200  cefTRIAXone (ROCEPHIN) 2 g in sodium chloride 0.9 % 100 mL IVPB     2 g 200 mL/hr over 30 Minutes Intravenous Every 24 hours 12/21/19 0142     12/21/19 0400  remdesivir 200 mg in sodium chloride 0.9% 250 mL IVPB     200 mg 580 mL/hr over 30 Minutes Intravenous Once 12/21/19 0142 12/21/19 0440   12/21/19 0000  cefTRIAXone (ROCEPHIN)  1 g in sodium chloride 0.9 % 100 mL IVPB     1 g 200 mL/hr over 30 Minutes Intravenous  Once 12/20/19 2357 12/21/19 0222   12/21/19 0000  azithromycin (ZITHROMAX) 500 mg in sodium chloride 0.9 % 250 mL IVPB     500 mg 250 mL/hr over 60 Minutes Intravenous  Once 12/20/19 2357 12/21/19 0222        Objective:   Vitals:   12/21/19 1129 12/21/19 1153 12/21/19 1200 12/21/19 1427  BP: (!) 89/66 127/74  117/76  Pulse:  94  77  Resp:  20  17  Temp: 98.1 F (36.7 C) 98.2 F (36.8 C) 98.2 F (36.8 C) 98.2 F (36.8 C)  TempSrc: Oral  Oral Oral  SpO2: 90%     Weight:      Height:        Wt Readings from Last 3 Encounters:  12/20/19 72.6 kg     Intake/Output Summary (Last 24 hours) at 12/21/2019 1443 Last data filed at 12/21/2019 1427 Gross per 24 hour  Intake 670 ml  Output --  Net 670 ml     Physical Exam  Gen: not in distress,, unable to verbally communicate. HEENT: Pallor present, moist mucosa Chest: Diminished bibasilar breath sounds CVS: N S1&S2, no murmurs GI: soft, NT, ND,  Musculoskeletal: warm, no edema     Data Review:    CBC Recent Labs  Lab 12/20/19 2042 12/21/19 0545  WBC 8.8  16.5* 8.3  HGB 8.2*  12.4 7.0*  HCT 30.2*  41.5 26.4*  PLT 359  222 310  MCV 67.3*  89.4 68.6*  MCH 18.3*  26.7 18.2*  MCHC 27.2*  29.9* 26.5*  RDW 19.0*  16.1* 19.1*    Chemistries  Recent Labs  Lab 12/20/19 2042  NA 137  K 5.4*  CL 99  CO2 23  GLUCOSE 119*  BUN 7  CREATININE 0.87  CALCIUM 8.3*  AST 24  ALT 8  ALKPHOS 106  BILITOT 0.9   ------------------------------------------------------------------------------------------------------------------ No results for input(s): CHOL, HDL, LDLCALC, TRIG, CHOLHDL, LDLDIRECT in the last 72 hours.  No results found for: HGBA1C ------------------------------------------------------------------------------------------------------------------ No results for input(s): TSH, T4TOTAL, T3FREE, THYROIDAB in the  last 72 hours.  Invalid input(s): FREET3 ------------------------------------------------------------------------------------------------------------------ No results for input(s): VITAMINB12, FOLATE, FERRITIN, TIBC, IRON, RETICCTPCT in the last 72 hours.  Coagulation profile Recent Labs  Lab 12/20/19 2248 12/21/19 0302  INR 1.0 1.1    No results for input(s): DDIMER in the last 72 hours.  Cardiac Enzymes No results for input(s): CKMB, TROPONINI, MYOGLOBIN in the last 168 hours.  Invalid input(s): CK ------------------------------------------------------------------------------------------------------------------ No results found for: BNP  Inpatient Medications  Scheduled Meds: . albuterol  2 puff Inhalation Q6H  . vitamin C  500 mg Oral Daily  . dexamethasone (DECADRON) injection  6 mg Intravenous Q24H  . enoxaparin (LOVENOX) injection  40 mg Subcutaneous Q24H  . multivitamin with minerals  1 tablet Oral Daily  . zinc sulfate  220 mg Oral Daily   Continuous Infusions: . azithromycin    . cefTRIAXone (ROCEPHIN)  IV    . [START ON 12/22/2019] remdesivir 100 mg in NS 100 mL     PRN Meds:.acetaminophen **OR** acetaminophen, chlorpheniramine-HYDROcodone, guaiFENesin-dextromethorphan, menthol-cetylpyridinium, ondansetron **OR** ondansetron (ZOFRAN) IV, phenol  Micro Results Recent Results (from the past 240 hour(s))  Group A Strep by PCR ([facility] Only)     Status: None   Collection Time: 12/20/19 11:52 PM   Specimen: Throat; Sterile Swab  Result Value Ref Range Status   Group A Strep by PCR NOT DETECTED NOT DETECTED Final    Comment: Performed at [facility], [address] [place], [place] [address]  Blood Culture (routine x 2)     Status: None (Preliminary result)   Collection Time: 12/21/19 12:36 AM   Specimen: Left Antecubital; Blood  Result Value Ref Range Status   Specimen Description LEFT ANTECUBITAL  Final   Special Requests   Final    Blood Culture  results may not be optimal due to an excessive volume of blood received in culture bottles   Culture   Final    NO GROWTH < 12 HOURS Performed at [facility], [address], [place], [place] [address]    Report Status PENDING  Incomplete  Blood Culture (routine x 2)     Status: None (Preliminary result)   Collection Time: 12/21/19  1:02 AM   Specimen: BLOOD  Result Value Ref Range Status   Specimen Description BLOOD RIGHT WRIST  Final   Special Requests   Final    BOTTLES DRAWN AEROBIC AND ANAEROBIC Blood Culture adequate volume   Culture   Final    NO GROWTH < 12 HOURS Performed at [facility], [address] [place], [place] [address]    Report Status PENDING  Incomplete    Radiology Reports CT Soft Tissue Neck W Contrast  Result Date: 12/21/2019 CLINICAL DATA:  Vomiting and sore throat EXAM: CT NECK WITH CONTRAST TECHNIQUE: Multidetector CT imaging of the neck was performed using the standard protocol following the bolus administration of intravenous contrast. CONTRAST:  65mL OMNIPAQUE IOHEXOL 300 MG/ML  SOLN COMPARISON:  None. FINDINGS: Pharynx and larynx: --Nasopharynx: Fossae of Rosenmuller are clear. Normal adenoid  tonsils for age. --Oral cavity and oropharynx: No visible lesion of the tongue or floor of mouth. Normal lingual and palatine tonsils. Oropharynx is clear. --Hypopharynx: Normal vallecula and pyriform sinuses. --Larynx: Normal epiglottis and pre-epiglottic space. Normal aryepiglottic and vocal folds. --Retropharyngeal space: No abscess, effusion or lymphadenopathy. Salivary glands: No right parotid gland is visualized. There is compensatory hypertrophy of the left parotid gland. Diminutive right submandibular gland. Thyroid: Normal. Lymph nodes: None enlarged or abnormal density. Vascular: Negative. Limited intracranial: Negative. Visualized orbits: Asymmetric but otherwise normal Mastoids and visualized paranasal sinuses: Clear. Skeleton: No acute or  aggressive process. Upper chest: Negative. Other: Facial asymmetry with atrophy of the right 10 paralysis and masseter muscles. IMPRESSION: 1. No acute abnormality of the neck. 2. 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 3. Facial asymmetry with atrophy of the right temporalis and masseter muscles. Diminutive right orbit. Electronically Signed   By: Ulyses Jarred M.D.   On: 12/21/2019 02:15   DG Chest Portable 1 View  Result Date: 12/20/2019 CLINICAL DATA:  Cough EXAM: PORTABLE CHEST 1 VIEW COMPARISON:  None. FINDINGS: There are subtle bilateral airspace opacities, for example in the left mid lung zone. There is no pneumothorax. No large pleural effusion. No acute osseous abnormality. The heart size is normal. IMPRESSION: Subtle airspace opacities as above, which may represent atelectasis or an atypical infectious process in the appropriate clinical setting. Electronically Signed   By: Constance Holster M.D.   On: 12/20/2019 23:45    Time Spent in minutes  35   Derward Marple M.D on 12/21/2019 at 2:43 PM  Between 7am to 7pm - Pager - [phone]  After 7pm go to [email or web address] - password [facility]  [facility] -  Office  [phone]

## 2019-12-21 NOTE — Progress Notes (Signed)
CODE SEPSIS - PHARMACY COMMUNICATION  **Broad Spectrum Antibiotics should be administered within 1 hour of Sepsis diagnosis**  Time Code Sepsis Called/Page Received: 2358  Antibiotics Ordered: Rocephin and Zithromax  Time of 1st antibiotic administration: 0118  Additional action taken by pharmacy: n/a  If necessary, Name of Provider/Nurse Contacted: n/a   Ena Dawley ,PharmD Clinical Pharmacist  12/21/2019  12:00 AM

## 2019-12-21 NOTE — ED Notes (Signed)
Assisted Pt to rest room.AS

## 2019-12-21 NOTE — Progress Notes (Signed)
Remdesivir - Pharmacy Brief Note   O:  ALT:  CXR: evidence of infection SpO2: 94% on    A/P:  Remdesivir 200 mg IVPB once followed by 100 mg IVPB daily x 4 days.   Hart Robinsons, PharmD Clinical Pharmacist   12/21/2019 2:51 AM

## 2019-12-21 NOTE — Progress Notes (Signed)
Pt husband called for an update.

## 2019-12-21 NOTE — Progress Notes (Signed)
1st unit pRBCs complete.  Pt vitals are stable. 2nd unit pRBCs released.

## 2019-12-21 NOTE — H&P (Signed)
History and Physical    [name]:[id number] DOB: 1981-02-08 DOA: 12/20/2019  PCP: Patient, No Pcp Per   Patient coming from: home  I have personally briefly reviewed patient's old medical records in [place]  Chief Complaint: sore throat, unable to swallow and fever  HPI: Joanna Nelson is a 39 y.o. female with medical history significant for complex partial seizures, progressive right hemifacial atrophy /linear scleroderma as well as a known right temporal lobe cavernoma, who presents to the emergency room with a 2-day history of sore throat resulting in inability to swallow both solids and liquids.  She also has a cough fever.  She is unable to speak on account of the sore throat.  Patient speaks in a whisper but mostly nods and shakes her head to the questions asked.  She denies chest pain, change in bowel habits or abdominal pain and denies dysuria.  Denies headache or visual disturbance or numbness tingling and weakness in the extremity.  She does have a baseline right upper extremity weakness based on past documentation. ED Course: On arrival in the emergency room she was febrile with a temperature of 103.2, tachycardic at 125, initially O2 sat was 95% on room air but then dropped to 88% and she was placed on oxygen.  Blood pressure was 123/75.,  White cell count was initially reported as 16,000 but this was reportedly an area and her white cell count is actually 8000.  Hemoglobin is low at 8.2, though originally reported as 12.4.  Lactic acid was 1.8.  Rapid Covid done in the emergency room, per ER physician, was positive, though not yet resulted in the record at the time of this dictation.  Chest x-ray showed subtle airspace opacities which may represent atelectasis or an atypical infectious process in the appropriate clinical setting.  EKG showed sinus tachycardia.  Analysis unremarkable.  She was started on treatment for sepsis secondary to pneumonia as well as Covid treatment.   Hospitalist consulted for admission.  CT soft tissue neck was requested based on degree of aphasia patient is experiencing and result is pending at the time of admission. Review of Systems: As per HPI otherwise 10 point review of systems negative.    History reviewed.  epilepsy   has no history on file for tobacco, alcohol, and drug.  No Known Allergies  No family history on file.   Prior to Admission medications   Not on File    Physical Exam: Vitals:   12/21/19 0015 12/21/19 0045 12/21/19 0100 12/21/19 0115  BP: 125/84 122/75 117/73 117/72  Pulse: (!) 115 (!) 113 (!) 111 (!) 114  Resp: 18 (!) 28 (!) 24 (!) 29  Temp:      TempSrc:      SpO2: 97% 95% 93% 96%  Weight:      Height:         Vitals:   12/21/19 0015 12/21/19 0045 12/21/19 0100 12/21/19 0115  BP: 125/84 122/75 117/73 117/72  Pulse: (!) 115 (!) 113 (!) 111 (!) 114  Resp: 18 (!) 28 (!) 24 (!) 29  Temp:      TempSrc:      SpO2: 97% 95% 93% 96%  Weight:      Height:        Constitutional: NAD, alert and oriented x 3 Eyes: PERRL, lids and conjunctivae normalH HENMT: Mucous membranes are moist. Right facial atrophy Neck: normal, supple, no masses, no thyromegaly Respiratory: clear to auscultation bilaterally, no wheezing, no crackles. Increased  respiratory effort with tachypnea. No accessory muscle use.  Cardiovascular: Tachycardic no murmurs / rubs / gallops. No extremity edema. 2+ pedal pulses. No carotid bruits.  Abdomen: no tenderness, no masses palpated. No hepatosplenomegaly. Bowel sounds positive.  Musculoskeletal: no clubbing / cyanosis. No joint deformity upper and lower extremities.  Skin: no rashes, lesions, ulcers.  Neurologic: No gross focal neurologic deficit. Psychiatric: Normal mood and affect.   Labs on Admission: I have personally reviewed following labs and imaging studies  CBC: Recent Labs  Lab 12/20/19 2042  WBC 8.8  16.5*  HGB 8.2*  12.4  HCT 30.2*  41.5  MCV 67.3*   89.4  PLT 359  638   Basic Metabolic Panel: Recent Labs  Lab 12/20/19 2042  NA 137  K 5.4*  CL 99  CO2 23  GLUCOSE 119*  BUN 7  CREATININE 0.87  CALCIUM 8.3*   GFR: Estimated Creatinine Clearance: 81.8 mL/min (by C-G formula based on SCr of 0.87 mg/dL). Liver Function Tests: Recent Labs  Lab 12/20/19 2042  AST 24  ALT 8  ALKPHOS 106  BILITOT 0.9  PROT 8.2*  ALBUMIN 3.5   No results for input(s): LIPASE, AMYLASE in the last 168 hours. No results for input(s): AMMONIA in the last 168 hours. Coagulation Profile: Recent Labs  Lab 12/20/19 2248  INR 1.0   Cardiac Enzymes: No results for input(s): CKTOTAL, CKMB, CKMBINDEX, TROPONINI in the last 168 hours. BNP (last 3 results) No results for input(s): PROBNP in the last 8760 hours. HbA1C: No results for input(s): HGBA1C in the last 72 hours. CBG: No results for input(s): GLUCAP in the last 168 hours. Lipid Profile: No results for input(s): CHOL, HDL, LDLCALC, TRIG, CHOLHDL, LDLDIRECT in the last 72 hours. Thyroid Function Tests: No results for input(s): TSH, T4TOTAL, FREET4, T3FREE, THYROIDAB in the last 72 hours. Anemia Panel: No results for input(s): VITAMINB12, FOLATE, FERRITIN, TIBC, IRON, RETICCTPCT in the last 72 hours. Urine analysis:    Component Value Date/Time   COLORURINE YELLOW (A) 12/21/2019 0036   APPEARANCEUR HAZY (A) 12/21/2019 0036   LABSPEC 1.013 12/21/2019 0036   PHURINE 7.0 12/21/2019 0036   GLUCOSEU 50 (A) 12/21/2019 0036   HGBUR SMALL (A) 12/21/2019 0036   BILIRUBINUR NEGATIVE 12/21/2019 0036   KETONESUR 20 (A) 12/21/2019 0036   PROTEINUR NEGATIVE 12/21/2019 0036   NITRITE NEGATIVE 12/21/2019 0036   LEUKOCYTESUR NEGATIVE 12/21/2019 0036    Radiological Exams on Admission: DG Chest Portable 1 View  Result Date: 12/20/2019 CLINICAL DATA:  Cough EXAM: PORTABLE CHEST 1 VIEW COMPARISON:  None. FINDINGS: There are subtle bilateral airspace opacities, for example in the left mid lung  zone. There is no pneumothorax. No large pleural effusion. No acute osseous abnormality. The heart size is normal. IMPRESSION: Subtle airspace opacities as above, which may represent atelectasis or an atypical infectious process in the appropriate clinical setting. Electronically Signed   By: Constance Holster M.D.   On: 12/20/2019 23:45    EKG: Independently reviewed.  No acute ST-T wave changes  Assessment/Plan    Sepsis ([place])   Pneumonia due to COVID-19 virus   Acute respiratory failure with hypoxia [facility])  Patient met sepsis criteria with tachycardia, tachypnea, fever and source of infection, pneumonia vs pharyngitis.  Lactic acid 1.8 IV fluid bolus with lactated Ringer's being administered IV Rocephin and azithromycin for possibility of bacterial infection IV remdesivir, IV dexamethasone, albuterol and vitamins per orders Oxygen to keep sats over 92% Proning as tolerated  Acute pharyngitis  Dysphagia Patient with sore throat that prevented any oral intake in the past 2 days Follow-up CT soft tissue neck to evaluate for peritonsillar abscess or other pathology Clear sips only as tolerated Strep test was negative Benzocaine lozenges/gargles for symptomatic relief    Anemia Hemoglobin 8.2.  Apparently originally reported as 12 Uncertain acuity Follow-up repeat hemoglobin  Iron studies and stool guaiac pending follow-up hemoglobin to assess for accuracy/stability    Complex partial epilepsy ([place]) Continue Depakote and Lamictal  Right facial atrophy/linear scleroderma Etiology uncertain per review of neurology note No acute issues suspected  Right temporal lobe cavernoma No acute related issues suspected at this time      DVT prophylaxis: lovenox  Code Status: full code  Family Communication: none  Disposition Plan: Back to previous home environment Consults called: none     Athena Masse MD [facility]     12/21/2019, 1:45 AM

## 2019-12-21 NOTE — Progress Notes (Addendum)
Pt Hbg level dropped on 1/23 from 12.4 to 8.2.  At [place] 12/21/19 pt hbg level was down to 7.0. MD ordered 2units pRBC. Pt has no obvious signs of bleeding.  No bowel movement occurrences.  Pt type and screen resulted and pt is tolerating first unit of pRBCs very well. No reactions noted. Pt vital remain stable. RN will remain at bedside for 1st 37mins.  Consent signed and in pt chart.

## 2019-12-22 ENCOUNTER — Other Ambulatory Visit: Payer: Self-pay

## 2019-12-22 DIAGNOSIS — D509 Iron deficiency anemia, unspecified: Secondary | ICD-10-CM

## 2019-12-22 DIAGNOSIS — U071 COVID-19: Secondary | ICD-10-CM

## 2019-12-22 DIAGNOSIS — J1282 Pneumonia due to coronavirus disease 2019: Secondary | ICD-10-CM

## 2019-12-22 LAB — TYPE AND SCREEN
ABO/RH(D): O POS
Antibody Screen: NEGATIVE
Unit division: 0
Unit division: 0

## 2019-12-22 LAB — COMPREHENSIVE METABOLIC PANEL
ALT: 12 U/L (ref 0–44)
AST: 23 U/L (ref 15–41)
Albumin: 2.8 g/dL — ABNORMAL LOW (ref 3.5–5.0)
Alkaline Phosphatase: 66 U/L (ref 38–126)
Anion gap: 8 (ref 5–15)
BUN: 11 mg/dL (ref 6–20)
CO2: 26 mmol/L (ref 22–32)
Calcium: 7.9 mg/dL — ABNORMAL LOW (ref 8.9–10.3)
Chloride: 108 mmol/L (ref 98–111)
Creatinine, Ser: 0.73 mg/dL (ref 0.44–1.00)
GFR calc Af Amer: >60 mL/min (ref >60)
GFR calc non Af Amer: >60 mL/min (ref >60)
Glucose, Bld: 112 mg/dL — ABNORMAL HIGH (ref 70–99)
Potassium: 3.5 mmol/L (ref 3.5–5.1)
Sodium: 142 mmol/L (ref 135–145)
Total Bilirubin: 0.8 mg/dL (ref 0.3–1.2)
Total Protein: 6.1 g/dL — ABNORMAL LOW (ref 6.5–8.1)

## 2019-12-22 LAB — BPAM RBC
Blood Product Expiration Date: 202101272359
Blood Product Expiration Date: 202101302359
ISSUE DATE / TIME: 202101241129
ISSUE DATE / TIME: 202101241443
Unit Type and Rh: 9500
Unit Type and Rh: 9500

## 2019-12-22 LAB — FIBRIN DERIVATIVES D-DIMER (ARMC ONLY): Fibrin derivatives D-dimer (ARMC): 402.52 ng/mL (FEU) (ref 0.00–499.00)

## 2019-12-22 LAB — CBC WITH DIFFERENTIAL/PLATELET
Abs Immature Granulocytes: 0.05 10*3/uL (ref 0.00–0.07)
Basophils Absolute: 0 10*3/uL (ref 0.0–0.1)
Basophils Relative: 0 %
Eosinophils Absolute: 0 10*3/uL (ref 0.0–0.5)
Eosinophils Relative: 0 %
HCT: 33 % — ABNORMAL LOW (ref 36.0–46.0)
Hemoglobin: 9.6 g/dL — ABNORMAL LOW (ref 12.0–15.0)
Immature Granulocytes: 1 %
Lymphocytes Relative: 21 %
Lymphs Abs: 2.2 10*3/uL (ref 0.7–4.0)
MCH: 21.4 pg — ABNORMAL LOW (ref 26.0–34.0)
MCHC: 29.1 g/dL — ABNORMAL LOW (ref 30.0–36.0)
MCV: 73.5 fL — ABNORMAL LOW (ref 80.0–100.0)
Monocytes Absolute: 1.2 10*3/uL — ABNORMAL HIGH (ref 0.1–1.0)
Monocytes Relative: 11 %
Neutro Abs: 7 10*3/uL (ref 1.7–7.7)
Neutrophils Relative %: 67 %
Platelets: 231 10*3/uL (ref 150–400)
RBC: 4.49 MIL/uL (ref 3.87–5.11)
RDW: 20.5 % — ABNORMAL HIGH (ref 11.5–15.5)
WBC: 10.5 10*3/uL (ref 4.0–10.5)
nRBC: 0.2 % (ref 0.0–0.2)

## 2019-12-22 LAB — URINE CULTURE

## 2019-12-22 LAB — C-REACTIVE PROTEIN: CRP: 5.1 mg/dL — ABNORMAL HIGH (ref <1.0)

## 2019-12-22 LAB — SAVE SMEAR(SSMR), FOR PROVIDER SLIDE REVIEW: Smear Review: NONE SEEN

## 2019-12-22 MED ORDER — LAMOTRIGINE 100 MG PO TABS
150.0000 mg | ORAL_TABLET | Freq: Every day | ORAL | Status: DC
  Administered 2019-12-22 – 2019-12-25 (×4): 150 mg via ORAL
  Filled 2019-12-22 (×3): qty 2

## 2019-12-22 MED ORDER — DM-GUAIFENESIN ER 30-600 MG PO TB12
1.0000 | ORAL_TABLET | Freq: Two times a day (BID) | ORAL | Status: DC
  Administered 2019-12-22 – 2019-12-25 (×6): 1 via ORAL
  Filled 2019-12-22 (×6): qty 1

## 2019-12-22 MED ORDER — CLOBAZAM 10 MG PO TABS
30.0000 mg | ORAL_TABLET | Freq: Every day | ORAL | Status: DC
  Administered 2019-12-22 – 2019-12-24 (×3): 30 mg via ORAL
  Filled 2019-12-22 (×2): qty 3

## 2019-12-22 MED ORDER — FOLIC ACID 1 MG PO TABS
1.0000 mg | ORAL_TABLET | Freq: Every day | ORAL | Status: DC
  Administered 2019-12-22 – 2019-12-25 (×4): 1 mg via ORAL
  Filled 2019-12-22 (×4): qty 1

## 2019-12-22 MED ORDER — DIVALPROEX SODIUM ER 500 MG PO TB24
500.0000 mg | ORAL_TABLET | Freq: Every day | ORAL | Status: DC
  Administered 2019-12-22 – 2019-12-24 (×3): 500 mg via ORAL
  Filled 2019-12-22 (×4): qty 1

## 2019-12-22 NOTE — Evaluation (Signed)
Clinical/Bedside Swallow Evaluation Patient Details  Name: Joanna Nelson MRN: [id number] Date of Birth: [birth year]  Today's Date: 12/22/2019 Time: SLP Start Time (ACUTE ONLY): 1315 SLP Stop Time (ACUTE ONLY): 1400 SLP Time Calculation (min) (ACUTE ONLY): 45 min  Past Medical History:  Past Medical History:  Diagnosis Date  . Epilepsy [facility])    Past Surgical History: History reviewed. No pertinent surgical history. HPI:  Pt is a 39 year old female with complex partial seizures, progressive right hemifacial atrophy/linear scleroderma, right temporal lobe cavernoma presented to the ED with 2-day history of sore throat with difficulty swallowing both solid and liquid also with cough and fever.  Patient has difficulty speaking due to severe sore throat.  In the ED she was septic with fever of 103 F, tachycardic, hypoxic with drop in O2 sat to 88%, leukocytosis with WBC of 16 K hemoglobin of 8.2 and lactic acid of 1.8.  Rapid Covid19 test was positive. Chest x-ray showing airspace opacity bilaterally.  CT of the soft tissue of Neck was Negative for any soft tissue swelling, obstruction or abscess.  Pt c/o a 2-day history of sore throat resulting in inability to swallow both solids and liquids, and this causing her to speak in a whisper d/t discomfort.  Both swallowing and voicing during conversation have improved today per pt report.  CXR: "Subtle airspace opacities which may represent atelectasis".   Assessment / Plan / Recommendation Clinical Impression  Pt appears to present w/ grossly adequate oropharyngeal phase swallow function w/ po trials during assessment today. She is at Reduced risk for aspiration when following general aspiration precautions. Pt does have a presentation of dysphonia w/ a raspy, horse vocal quality - a complaint for past ~3-4 days but improved somewhat today per pt report. Pt denied Odynophagia in general. During evaluation today, pt required min verbal cues for education on  sitting upright for po intake -- for safer swallowing during po intake. Also gave education on NOT using Chloraseptic spray BEFORE eating/drinking d/t the numbing effect and risk for a dysphagia, aspiration event. Pt then fed self drinks po trials of thin liquids w/ and w/out the straw and purees (no soft solids at this assessment d/t throat irritation). No overt clinical s/s of aspiration were noted for all trials -- no decline in vocal quality or respiratory effort during/post trials of liquids or solids. Pt stated when she drinks "too much water at a time, it makes my throat hurt".  She denied odynophagia w/ single sips. Oral phase c/b adequate oral management of boluses -- timely manipulation and A-P transfer noted; oral clearing achieved post all trials/consistencies given. OM exam WFL; native Dentition. Recommend a Full Liquid diet; Thin liquids. Trials to upgrade food consistency next 1-2 days as throat irriation improves. Recommend general aspiration precautions. Recommend Pills in Puree for for more comfortable swallowing if needed. ST services will continue to monitor while admitted; no needs indicated at discharge at this time. Recommend f/u w/ ENT for direct viewing if vocal quality does not improve over next ~2 weeks.  SLP Visit Diagnosis: Dysphagia, unspecified (R13.10)    Aspiration Risk  Mild aspiration risk;Risk for inadequate nutrition/hydration(reduced when following general aspiration precs)    Diet Recommendation  Full Liquid diet w/ thin liquids; general aspiration precautions  Medication Administration: Whole meds with puree(for more comfortable swallowing if needed)    Other  Recommendations Recommended Consults: Consider ENT evaluation(if voicing does not improve) Oral Care Recommendations: Oral care BID;Patient independent with oral care Other Recommendations: (n/a)  Follow up Recommendations None      Frequency and Duration min 2x/week  1 week       Prognosis  Prognosis for Safe Diet Advancement: Good Barriers to Reach Goals: Time post onset;Severity of deficits Barriers/Prognosis Comment: irritated throat; declined vocal quality      Swallow Study   General HPI: Pt is a 39 year old female with complex partial seizures, progressive right hemifacial atrophy/linear scleroderma, right temporal lobe cavernoma presented to the ED with 2-day history of sore throat with difficulty swallowing both solid and liquid also with cough and fever.  Patient has difficulty speaking due to severe sore throat.  In the ED she was septic with fever of 103 F, tachycardic, hypoxic with drop in O2 sat to 88%, leukocytosis with WBC of 16 K hemoglobin of 8.2 and lactic acid of 1.8.  Rapid Covid19 test was positive. Chest x-ray showing airspace opacity bilaterally.  CT of the soft tissue of Neck was Negative for any soft tissue swelling, obstruction or abscess.  Pt c/o a 2-day history of sore throat resulting in inability to swallow both solids and liquids, and this causing her to speak in a whisper d/t discomfort.  Both swallowing and voicing during conversation have improved today per pt report.  CXR: "Subtle airspace opacities which may represent atelectasis". Type of Study: Bedside Swallow Evaluation Previous Swallow Assessment: none reported Diet Prior to this Study: Thin liquids(clear liquid diet at admit; regular diet at home per pt) Temperature Spikes Noted: No(wbc 10.5) Respiratory Status: Nasal cannula(1-2 L) History of Recent Intubation: No Behavior/Cognition: Alert;Cooperative;Pleasant mood Oral Cavity Assessment: Within Functional Limits Oral Care Completed by SLP: Yes Oral Cavity - Dentition: Adequate natural dentition Vision: Functional for self-feeding Self-Feeding Abilities: Able to feed self;Needs assist;Needs set up Patient Positioning: Upright in bed(needed min cues for positioning) Baseline Vocal Quality: Hoarse(raspy but improved) Volitional Cough:  Strong;Congested(min) Volitional Swallow: Able to elicit    Oral/Motor/Sensory Function Overall Oral Motor/Sensory Function: Within functional limits   Ice Chips Ice chips: Within functional limits Presentation: Spoon(fed; 2 trials)   Thin Liquid Thin Liquid: Within functional limits Presentation: Cup;Self Fed;Straw(6+ trials via each) Other Comments: pt stated when she drinks "too much water at a time, it makes my throat hurt".  She denied odynophagia w/ single sips.     Nectar Thick Nectar Thick Liquid: Not tested   Honey Thick Honey Thick Liquid: Not tested   Puree Puree: Within functional limits Presentation: Self Fed;Spoon(10+ trials)   Solid     Solid: Not tested Other Comments: d/t throat irritation        Orinda Kenner, MS, CCC-SLP Eddy Liszewski 12/22/2019,4:20 PM

## 2019-12-22 NOTE — Progress Notes (Signed)
PROGRESS NOTE                                                                                                                                                                                                             Patient Demographics:    Joanna Nelson, is a 39 y.o. female, DOB - 1981/09/05, [id number]  Admit date - 12/20/2019   Admitting Physician Athena Masse, MD  Outpatient Primary MD for the patient is Patient, No Pcp Per  LOS - 1    Chief Complaint  Patient presents with  . Emesis  . Sore Throat       Brief Narrative   39 year old female with complex partial seizures, progressive right hemifacial atrophy/linear scleroderma, right temporal lobe cavernoma presented to the ED with 2-day history of sore throat with difficulty swallowing both solid and liquid also with cough and fever.  Patient has difficulty speaking due to severe sore throat.  In the ED she was septic with fever of 103 F, tachycardic, hypoxic with drop in O2 sat to 88%, leukocytosis with WBC of 16 K hemoglobin of 8.2 and lactic acid of 1.8.  Rapid flu test was positive. Chest x-ray showing airspace opacity bilaterally.  CT of the soft tissue neck was negative for any soft tissue swelling, obstruction or abscess. Admitted for sepsis due to COVID-19 and possible pneumonia.   Subjective:    Still feels congested but able to speak better today.  Noted to have O2 sat dropped to high 80s overnight on room air.  Currently stable on 2 L.   Assessment  & Plan :    Principal Problem:  Severe sepsis ([place]) Acute respiratory failure with hypoxia secondary to COVID-19 infection Airborne and contact precautions.  Continue 2 L O2 via nasal cannula.  Empiric IV Decadron and remdesivir.  (Day 2) Continue vitamin C and zinc.  Albuterol inhaler and antitussives. Blood cultures negative.  Since procalcitonin is normal and not febrile I think her pneumonia  is due to COVID-19 and not superimposed bacterial infection.  UA negative for infection.  I will discontinue further antibiotic. Continue IV fluids.  Active problems ?  Acute pharyngitis with dysphagia Difficulty with swallowing.  CT of the soft tissue neck without any abscess or obstruction.  Clears for now.  Rapid strep negative.  Ordered lozenges and spray with improvement.  Refuses to advance diet today.  Acute versus chronic microcytic anemia. Significant drop in H&H to 7 from twelve 1 day back.  No prior blood work in the system to confirm if this is acute versus chronic.  Stool for Hemoccult pending. Continue IV Protonix every 12 hours.  Denies recent EGD or colonoscopy. H&H improved with 2 unit PRBC. Iron panel normal.  Normal B12 but low folate.  Added folic acid supplement.  Complex partial seizures Continue Depakote and Lamictal  Right facial atrophy/linear scleroderma Outpatient follow-up with neurology.  Hyperkalemia Resolved.    Code Status : Full code  Family Communication  : None  Disposition Plan  : Home possibly in the next 24-48 hours if clinically improved with plan on outpatient remdesivir infusion.  Barriers For Discharge : Active symptoms (hypoxia, anemia, dysphagia)  Consults  : None  Procedures  : CT soft tissue neck  DVT Prophylaxis  : Subcu Lovenox  Lab Results  Component Value Date   PLT 231 12/22/2019    Antibiotics  :  Anti-infectives (From admission, onward)   Start     Dose/Rate Route Frequency Ordered Stop   12/22/19 1000  remdesivir 100 mg in sodium chloride 0.9 % 100 mL IVPB     100 mg 200 mL/hr over 30 Minutes Intravenous Daily 12/21/19 0142 12/26/19 0959   12/21/19 2230  azithromycin (ZITHROMAX) 500 mg in sodium chloride 0.9 % 250 mL IVPB     500 mg 250 mL/hr over 60 Minutes Intravenous Every 24 hours 12/21/19 0142     12/21/19 2200  cefTRIAXone (ROCEPHIN) 2 g in sodium chloride 0.9 % 100 mL IVPB     2 g 200 mL/hr over 30  Minutes Intravenous Every 24 hours 12/21/19 0142     12/21/19 0400  remdesivir 200 mg in sodium chloride 0.9% 250 mL IVPB     200 mg 580 mL/hr over 30 Minutes Intravenous Once 12/21/19 0142 12/21/19 0440   12/21/19 0000  cefTRIAXone (ROCEPHIN) 1 g in sodium chloride 0.9 % 100 mL IVPB     1 g 200 mL/hr over 30 Minutes Intravenous  Once 12/20/19 2357 12/21/19 0222   12/21/19 0000  azithromycin (ZITHROMAX) 500 mg in sodium chloride 0.9 % 250 mL IVPB     500 mg 250 mL/hr over 60 Minutes Intravenous  Once 12/20/19 2357 12/21/19 0222        Objective:   Vitals:   12/22/19 0730 12/22/19 0800 12/22/19 0830 12/22/19 0930  BP:  127/75    Pulse:  65 77 70  Resp:  15 20 20   Temp: 98.4 F (36.9 C) 98.5 F (36.9 C)    TempSrc: Oral Oral    SpO2:  93% 99% 96%  Weight:      Height:        Wt Readings from Last 3 Encounters:  12/20/19 72.6 kg     Intake/Output Summary (Last 24 hours) at 12/22/2019 1249 Last data filed at 12/22/2019 0700 Gross per 24 hour  Intake 1110 ml  Output -  Net 1110 ml   Physical exam Not in distress, able to talk better (although muffled) HEENT: Moist mucosa, supple neck Chest: Improved breath sounds bilaterally CVs: Normal S1-S2 GI: Soft, nondistended, nontender Musculoskeletal: Warm, no edema      Data Review:    CBC Recent Labs  Lab 12/20/19 2042 12/21/19 0545 12/22/19 0508  WBC 8.8  16.5* 8.3 10.5  HGB 8.2*  12.4 7.0* 9.6*  HCT 30.2*  41.5 26.4* 33.0*  PLT 359  222 310 231  MCV 67.3*  89.4 68.6* 73.5*  MCH 18.3*  26.7 18.2* 21.4*  MCHC 27.2*  29.9* 26.5* 29.1*  RDW 19.0*  16.1* 19.1* 20.5*  LYMPHSABS  --   --  2.2  MONOABS  --   --  1.2*  EOSABS  --   --  0.0  BASOSABS  --   --  0.0    Chemistries  Recent Labs  Lab 12/20/19 2042 12/22/19 0508  NA 137 142  K 5.4* 3.5  CL 99 108  CO2 23 26  GLUCOSE 119* 112*  BUN 7 11  CREATININE 0.87 0.73  CALCIUM 8.3* 7.9*  AST 24 23  ALT 8 12  ALKPHOS 106 66  BILITOT 0.9 0.8    ------------------------------------------------------------------------------------------------------------------ No results for input(s): CHOL, HDL, LDLCALC, TRIG, CHOLHDL, LDLDIRECT in the last 72 hours.  No results found for: HGBA1C ------------------------------------------------------------------------------------------------------------------ No results for input(s): TSH, T4TOTAL, T3FREE, THYROIDAB in the last 72 hours.  Invalid input(s): FREET3 ------------------------------------------------------------------------------------------------------------------ Recent Labs    12/21/19 1538  VITAMINB12 399  FOLATE 5.5*  TIBC 420  IRON 82    Coagulation profile Recent Labs  Lab 12/20/19 2248 12/21/19 0302  INR 1.0 1.1    No results for input(s): DDIMER in the last 72 hours.  Cardiac Enzymes No results for input(s): CKMB, TROPONINI, MYOGLOBIN in the last 168 hours.  Invalid input(s): CK ------------------------------------------------------------------------------------------------------------------ No results found for: BNP  Inpatient Medications  Scheduled Meds: . albuterol  2 puff Inhalation Q6H  . vitamin C  500 mg Oral Daily  . dexamethasone (DECADRON) injection  6 mg Intravenous Q24H  . enoxaparin (LOVENOX) injection  40 mg Subcutaneous Q24H  . folic acid  1 mg Oral Daily  . multivitamin with minerals  1 tablet Oral Daily  . pantoprazole (PROTONIX) IV  40 mg Intravenous Q12H  . zinc sulfate  220 mg Oral Daily   Continuous Infusions: . sodium chloride 100 mL/hr at 12/21/19 1736  . azithromycin 500 mg (12/21/19 2135)  . cefTRIAXone (ROCEPHIN)  IV 2 g (12/21/19 2109)  . remdesivir 100 mg in NS 100 mL 100 mg (12/22/19 0854)   PRN Meds:.acetaminophen **OR** acetaminophen, chlorpheniramine-HYDROcodone, guaiFENesin-dextromethorphan, menthol-cetylpyridinium, ondansetron **OR** ondansetron (ZOFRAN) IV, phenol  Micro Results Recent Results (from the past 240  hour(s))  Group A Strep by PCR ([place] Only)     Status: None   Collection Time: 12/20/19 11:52 PM   Specimen: Throat; Sterile Swab  Result Value Ref Range Status   Group A Strep by PCR NOT DETECTED NOT DETECTED Final    Comment: Performed at [facility], [address] [place], [place] [address]  Blood Culture (routine x 2)     Status: None (Preliminary result)   Collection Time: 12/21/19 12:36 AM   Specimen: Left Antecubital; Blood  Result Value Ref Range Status   Specimen Description LEFT ANTECUBITAL  Final   Special Requests   Final    Blood Culture results may not be optimal due to an excessive volume of blood received in culture bottles   Culture   Final    NO GROWTH 1 DAY Performed at [facility], [address], [place], [place] [address]    Report Status PENDING  Incomplete  Urine culture     Status: Abnormal   Collection Time: 12/21/19 12:40 AM   Specimen: In/Out Cath Urine  Result Value Ref Range Status   Specimen Description   Final    IN/OUT CATH  URINE Performed at [facility], [address] [place], [place] [address]    Special Requests   Final    NONE Performed at [facility], [address] [place], [place] [address]    Culture MULTIPLE SPECIES PRESENT, SUGGEST RECOLLECTION (A)  Final   Report Status 12/22/2019 FINAL  Final  Blood Culture (routine x 2)     Status: None (Preliminary result)   Collection Time: 12/21/19  1:02 AM   Specimen: BLOOD  Result Value Ref Range Status   Specimen Description BLOOD RIGHT WRIST  Final   Special Requests   Final    BOTTLES DRAWN AEROBIC AND ANAEROBIC Blood Culture adequate volume   Culture   Final    NO GROWTH 1 DAY Performed at [facility], [address], [place], [place] [address]    Report Status PENDING  Incomplete    Radiology Reports CT Soft Tissue Neck W Contrast  Result Date: 12/21/2019 CLINICAL DATA:  Vomiting and sore throat EXAM: CT NECK WITH  CONTRAST TECHNIQUE: Multidetector CT imaging of the neck was performed using the standard protocol following the bolus administration of intravenous contrast. CONTRAST:  67mL OMNIPAQUE IOHEXOL 300 MG/ML  SOLN COMPARISON:  None. FINDINGS: Pharynx and larynx: --Nasopharynx: Fossae of [place]muller are clear. Normal adenoid tonsils for age. --Oral cavity and oropharynx: No visible lesion of the tongue or floor of mouth. Normal lingual and palatine tonsils. Oropharynx is clear. --Hypopharynx: Normal vallecula and pyriform sinuses. --Larynx: Normal epiglottis and pre-epiglottic space. Normal aryepiglottic and vocal folds. --Retropharyngeal space: No abscess, effusion or lymphadenopathy. Salivary glands: No right parotid gland is visualized. There is compensatory hypertrophy of the left parotid gland. Diminutive right submandibular gland. Thyroid: Normal. Lymph nodes: None enlarged or abnormal density. Vascular: Negative. Limited intracranial: Negative. Visualized orbits: Asymmetric but otherwise normal Mastoids and visualized paranasal sinuses: Clear. Skeleton: No acute or aggressive process. Upper chest: Negative. Other: Facial asymmetry with atrophy of the right 10 paralysis and masseter muscles. IMPRESSION: 1. No acute abnormality of the neck. 2. 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 3. Facial asymmetry with atrophy of the right temporalis and masseter muscles. Diminutive right orbit. Electronically Signed   By: Ulyses Jarred M.D.   On: 12/21/2019 02:15   DG Chest Portable 1 View  Result Date: 12/20/2019 CLINICAL DATA:  Cough EXAM: PORTABLE CHEST 1 VIEW COMPARISON:  None. FINDINGS: There are subtle bilateral airspace opacities, for example in the left mid lung zone. There is no pneumothorax. No large pleural effusion. No acute osseous abnormality. The heart size is normal. IMPRESSION: Subtle airspace opacities as above, which may represent atelectasis or an  atypical infectious process in the appropriate clinical setting. Electronically Signed   By: Constance Holster M.D.   On: 12/20/2019 23:45    Time Spent in minutes  35   Nurah Petrides M.D on 12/22/2019 at 12:49 PM  Between 7am to 7pm - Pager - [phone]  After 7pm go to [email or web address] - password [facility]  [facility] -  Office  [phone]

## 2019-12-23 LAB — COMPREHENSIVE METABOLIC PANEL
ALT: 14 U/L (ref 0–44)
AST: 25 U/L (ref 15–41)
Albumin: 2.6 g/dL — ABNORMAL LOW (ref 3.5–5.0)
Alkaline Phosphatase: 64 U/L (ref 38–126)
Anion gap: 6 (ref 5–15)
BUN: 11 mg/dL (ref 6–20)
CO2: 28 mmol/L (ref 22–32)
Calcium: 7.8 mg/dL — ABNORMAL LOW (ref 8.9–10.3)
Chloride: 104 mmol/L (ref 98–111)
Creatinine, Ser: 0.67 mg/dL (ref 0.44–1.00)
GFR calc Af Amer: >60 mL/min (ref >60)
GFR calc non Af Amer: >60 mL/min (ref >60)
Glucose, Bld: 158 mg/dL — ABNORMAL HIGH (ref 70–99)
Potassium: 3.5 mmol/L (ref 3.5–5.1)
Sodium: 138 mmol/L (ref 135–145)
Total Bilirubin: 0.8 mg/dL (ref 0.3–1.2)
Total Protein: 5.8 g/dL — ABNORMAL LOW (ref 6.5–8.1)

## 2019-12-23 LAB — CBC WITH DIFFERENTIAL/PLATELET
Abs Immature Granulocytes: 0.09 10*3/uL — ABNORMAL HIGH (ref 0.00–0.07)
Basophils Absolute: 0 10*3/uL (ref 0.0–0.1)
Basophils Relative: 0 %
Eosinophils Absolute: 0 10*3/uL (ref 0.0–0.5)
Eosinophils Relative: 0 %
HCT: 32.9 % — ABNORMAL LOW (ref 36.0–46.0)
Hemoglobin: 9.4 g/dL — ABNORMAL LOW (ref 12.0–15.0)
Immature Granulocytes: 1 %
Lymphocytes Relative: 18 %
Lymphs Abs: 1.5 10*3/uL (ref 0.7–4.0)
MCH: 21 pg — ABNORMAL LOW (ref 26.0–34.0)
MCHC: 28.6 g/dL — ABNORMAL LOW (ref 30.0–36.0)
MCV: 73.6 fL — ABNORMAL LOW (ref 80.0–100.0)
Monocytes Absolute: 0.6 10*3/uL (ref 0.1–1.0)
Monocytes Relative: 8 %
Neutro Abs: 6.1 10*3/uL (ref 1.7–7.7)
Neutrophils Relative %: 73 %
Platelets: 231 10*3/uL (ref 150–400)
RBC: 4.47 MIL/uL (ref 3.87–5.11)
RDW: 21.9 % — ABNORMAL HIGH (ref 11.5–15.5)
Smear Review: NORMAL
WBC: 8.3 10*3/uL (ref 4.0–10.5)
nRBC: 0.4 % — ABNORMAL HIGH (ref 0.0–0.2)

## 2019-12-23 LAB — C-REACTIVE PROTEIN: CRP: 5.2 mg/dL — ABNORMAL HIGH (ref <1.0)

## 2019-12-23 LAB — FIBRIN DERIVATIVES D-DIMER (ARMC ONLY): Fibrin derivatives D-dimer (ARMC): 364.33 ng/mL (FEU) (ref 0.00–499.00)

## 2019-12-23 MED ORDER — POTASSIUM CHLORIDE CRYS ER 20 MEQ PO TBCR
40.0000 meq | EXTENDED_RELEASE_TABLET | Freq: Once | ORAL | Status: AC
  Administered 2019-12-23: 15:00:00 40 meq via ORAL
  Filled 2019-12-23: qty 2

## 2019-12-23 NOTE — Progress Notes (Signed)
PROGRESS NOTE                                                                                                                                                                                                             Patient Demographics:    Joanna Nelson, is a 39 y.o. female, DOB - 10-10-1981, [id number]  Admit date - 12/20/2019   Admitting Physician Athena Masse, MD  Outpatient Primary MD for the patient is Patient, No Pcp Per  LOS - 2    Chief Complaint  Patient presents with  . Emesis  . Sore Throat       Brief Narrative   39 year old female with complex partial seizures, progressive right hemifacial atrophy/linear scleroderma, right temporal lobe cavernoma presented to the ED with 2-day history of sore throat with difficulty swallowing both solid and liquid also with cough and fever.  Patient has difficulty speaking due to severe sore throat.  In the ED she was septic with fever of 103 F, tachycardic, hypoxic with drop in O2 sat to 88%, leukocytosis with WBC of 16 K hemoglobin of 8.2 and lactic acid of 1.8.  She was tested positive for COVID-19. Chest x-ray showing airspace opacity bilaterally.  CT of the soft tissue neck was negative for any soft tissue swelling, obstruction or abscess. Admitted for sepsis due to COVID-19 and possible pneumonia.   Subjective:   Still has some cough and throat discomfort but continues to feel better.  Maintaining sats on 1-2 L.   Assessment  & Plan :    Principal Problem:  Severe sepsis ([facility]) Acute respiratory failure with hypoxia secondary to COVID-19 infection Airborne and contact precautions.  Continue 2 L O2 via nasal cannula.  Empiric IV Decadron and remdesivir (Day 3).  Plan on inpatient completion of 5 days of IV remdesivir. Continue vitamin C and zinc.  Albuterol inhaler and antitussives. Blood cultures negative.   Active problems ?  Acute pharyngitis with  dysphagia Difficulty with swallowing.  CT of the soft tissue neck without any abscess or obstruction.  Clears for now.  Rapid strep negative.  Improving with lozenges and Chloraseptic spray. Diet advanced to soft.  Seen by SLP.  Acute versus chronic microcytic anemia. Significant drop in H&H to 7 from twelve 1 day back.  No prior blood work in the system to confirm  if this is acute versus chronic.  Stool for Hemoccult pending. Continue IV Protonix every 12 hours.  Denies recent EGD or colonoscopy. H&H improved with 2 unit PRBC. Iron panel normal.  Normal B12 but low folate.  Added folic acid supplement.  Complex partial seizures Continue Depakote and Lamictal  Right facial atrophy/linear scleroderma Outpatient follow-up with neurology.  Hyperkalemia Resolved.  Now hypokalemic.  Will replenish.    Code Status : Full code  Family Communication  : None  Disposition Plan  : Home possibly on 1/5:28 days of IV remdesivir completed.  Barriers For Discharge : Active symptoms (hypoxia, active COVID-19 pneumonia.)  Consults  : None  Procedures  : CT soft tissue neck  DVT Prophylaxis  : Subcu Lovenox  Lab Results  Component Value Date   PLT 231 12/23/2019    Antibiotics  :  Anti-infectives (From admission, onward)   Start     Dose/Rate Route Frequency Ordered Stop   12/22/19 1000  remdesivir 100 mg in sodium chloride 0.9 % 100 mL IVPB     100 mg 200 mL/hr over 30 Minutes Intravenous Daily 12/21/19 0142 12/26/19 0959   12/21/19 2230  azithromycin (ZITHROMAX) 500 mg in sodium chloride 0.9 % 250 mL IVPB  Status:  Discontinued     500 mg 250 mL/hr over 60 Minutes Intravenous Every 24 hours 12/21/19 0142 12/22/19 1250   12/21/19 2200  cefTRIAXone (ROCEPHIN) 2 g in sodium chloride 0.9 % 100 mL IVPB  Status:  Discontinued     2 g 200 mL/hr over 30 Minutes Intravenous Every 24 hours 12/21/19 0142 12/22/19 1250   12/21/19 0400  remdesivir 200 mg in sodium chloride 0.9% 250 mL IVPB       200 mg 580 mL/hr over 30 Minutes Intravenous Once 12/21/19 0142 12/21/19 0440   12/21/19 0000  cefTRIAXone (ROCEPHIN) 1 g in sodium chloride 0.9 % 100 mL IVPB     1 g 200 mL/hr over 30 Minutes Intravenous  Once 12/20/19 2357 12/21/19 0222   12/21/19 0000  azithromycin (ZITHROMAX) 500 mg in sodium chloride 0.9 % 250 mL IVPB     500 mg 250 mL/hr over 60 Minutes Intravenous  Once 12/20/19 2357 12/21/19 0222        Objective:   Vitals:   12/23/19 0700 12/23/19 0730 12/23/19 0800 12/23/19 0900  BP:  134/79 120/72   Pulse:  92 64   Resp:  16 20 20   Temp:  98.2 F (36.8 C)    TempSrc:  Oral    SpO2:  96% 97% 92%  Weight: 79.3 kg     Height:        Wt Readings from Last 3 Encounters:  12/23/19 79.3 kg     Intake/Output Summary (Last 24 hours) at 12/23/2019 1112 Last data filed at 12/22/2019 1500 Gross per 24 hour  Intake 300 ml  Output --  Net 300 ml   Physical exam Able to talk even better. HEENT: Moist mucosa, supple neck Chest: Clear bilaterally CVs: Normal S1-S2 GI: Soft, nontender, nondistended Musculoskeletal: Warm, no edema     Data Review:    CBC Recent Labs  Lab 12/20/19 2042 12/21/19 0545 12/22/19 0508 12/23/19 0703  WBC 8.8  16.5* 8.3 10.5 8.3  HGB 8.2*  12.4 7.0* 9.6* 9.4*  HCT 30.2*  41.5 26.4* 33.0* 32.9*  PLT 359  222 310 231 231  MCV 67.3*  89.4 68.6* 73.5* 73.6*  MCH 18.3*  26.7 18.2* 21.4* 21.0*  MCHC 27.2*  29.9* 26.5* 29.1* 28.6*  RDW 19.0*  16.1* 19.1* 20.5* 21.9*  LYMPHSABS  --   --  2.2 1.5  MONOABS  --   --  1.2* 0.6  EOSABS  --   --  0.0 0.0  BASOSABS  --   --  0.0 0.0    Chemistries  Recent Labs  Lab 12/20/19 2042 12/22/19 0508 12/23/19 0703  NA 137 142 138  K 5.4* 3.5 3.5  CL 99 108 104  CO2 23 26 28   GLUCOSE 119* 112* 158*  BUN 7 11 11   CREATININE 0.87 0.73 0.67  CALCIUM 8.3* 7.9* 7.8*  AST 24 23 25   ALT 8 12 14   ALKPHOS 106 66 64  BILITOT 0.9 0.8 0.8    ------------------------------------------------------------------------------------------------------------------ No results for input(s): CHOL, HDL, LDLCALC, TRIG, CHOLHDL, LDLDIRECT in the last 72 hours.  No results found for: HGBA1C ------------------------------------------------------------------------------------------------------------------ No results for input(s): TSH, T4TOTAL, T3FREE, THYROIDAB in the last 72 hours.  Invalid input(s): FREET3 ------------------------------------------------------------------------------------------------------------------ Recent Labs    12/21/19 1538  VITAMINB12 399  FOLATE 5.5*  TIBC 420  IRON 82    Coagulation profile Recent Labs  Lab 12/20/19 2248 12/21/19 0302  INR 1.0 1.1    No results for input(s): DDIMER in the last 72 hours.  Cardiac Enzymes No results for input(s): CKMB, TROPONINI, MYOGLOBIN in the last 168 hours.  Invalid input(s): CK ------------------------------------------------------------------------------------------------------------------ No results found for: BNP  Inpatient Medications  Scheduled Meds: . albuterol  2 puff Inhalation Q6H  . vitamin C  500 mg Oral Daily  . cloBAZam  30 mg Oral QHS  . dexamethasone (DECADRON) injection  6 mg Intravenous Q24H  . dextromethorphan-guaiFENesin  1 tablet Oral BID  . divalproex  500 mg Oral QHS  . enoxaparin (LOVENOX) injection  40 mg Subcutaneous Q24H  . folic acid  1 mg Oral Daily  . lamoTRIgine  150 mg Oral Daily  . multivitamin with minerals  1 tablet Oral Daily  . pantoprazole (PROTONIX) IV  40 mg Intravenous Q12H  . zinc sulfate  220 mg Oral Daily   Continuous Infusions: . remdesivir 100 mg in NS 100 mL 100 mg (12/23/19 0909)   PRN Meds:.acetaminophen **OR** acetaminophen, chlorpheniramine-HYDROcodone, guaiFENesin-dextromethorphan, menthol-cetylpyridinium, ondansetron **OR** ondansetron (ZOFRAN) IV, phenol  Micro Results Recent Results (from the  past 240 hour(s))  Group A Strep by PCR ([place] Only)     Status: None   Collection Time: 12/20/19 11:52 PM   Specimen: Throat; Sterile Swab  Result Value Ref Range Status   Group A Strep by PCR NOT DETECTED NOT DETECTED Final    Comment: Performed at [facility], [address] [place], [place] [address]  Blood Culture (routine x 2)     Status: None (Preliminary result)   Collection Time: 12/21/19 12:36 AM   Specimen: Left Antecubital; Blood  Result Value Ref Range Status   Specimen Description LEFT ANTECUBITAL  Final   Special Requests   Final    Blood Culture results may not be optimal due to an excessive volume of blood received in culture bottles   Culture   Final    NO GROWTH 2 DAYS Performed at [facility], [address], [place], [place] [address]    Report Status PENDING  Incomplete  Urine culture     Status: Abnormal   Collection Time: 12/21/19 12:40 AM   Specimen: In/Out Cath Urine  Result Value Ref Range Status   Specimen Description   Final  IN/OUT CATH URINE Performed at [facility], [address] [place], [place] [address]    Special Requests   Final    NONE Performed at [facility], [address] [place], [place] [address]    Culture MULTIPLE SPECIES PRESENT, SUGGEST RECOLLECTION (A)  Final   Report Status 12/22/2019 FINAL  Final  Blood Culture (routine x 2)     Status: None (Preliminary result)   Collection Time: 12/21/19  1:02 AM   Specimen: BLOOD  Result Value Ref Range Status   Specimen Description BLOOD RIGHT WRIST  Final   Special Requests   Final    BOTTLES DRAWN AEROBIC AND ANAEROBIC Blood Culture adequate volume   Culture   Final    NO GROWTH 2 DAYS Performed at [facility], [address] [place], [place] [address]    Report Status PENDING  Incomplete    Radiology Reports CT Soft Tissue Neck W Contrast  Result Date: 12/21/2019 CLINICAL DATA:  Vomiting and sore throat EXAM: CT NECK  WITH CONTRAST TECHNIQUE: Multidetector CT imaging of the neck was performed using the standard protocol following the bolus administration of intravenous contrast. CONTRAST:  41mL OMNIPAQUE IOHEXOL 300 MG/ML  SOLN COMPARISON:  None. FINDINGS: Pharynx and larynx: --Nasopharynx: Fossae of Rosenmuller are clear. Normal adenoid tonsils for age. --Oral cavity and oropharynx: No visible lesion of the tongue or floor of mouth. Normal lingual and palatine tonsils. Oropharynx is clear. --Hypopharynx: Normal vallecula and pyriform sinuses. --Larynx: Normal epiglottis and pre-epiglottic space. Normal aryepiglottic and vocal folds. --Retropharyngeal space: No abscess, effusion or lymphadenopathy. Salivary glands: No right parotid gland is visualized. There is compensatory hypertrophy of the left parotid gland. Diminutive right submandibular gland. Thyroid: Normal. Lymph nodes: None enlarged or abnormal density. Vascular: Negative. Limited intracranial: Negative. Visualized orbits: Asymmetric but otherwise normal Mastoids and visualized paranasal sinuses: Clear. Skeleton: No acute or aggressive process. Upper chest: Negative. Other: Facial asymmetry with atrophy of the right 10 paralysis and masseter muscles. IMPRESSION: 1. No acute abnormality of the neck. 2. 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 3. Facial asymmetry with atrophy of the right temporalis and masseter muscles. Diminutive right orbit. Electronically Signed   By: Ulyses Jarred M.D.   On: 12/21/2019 02:15   DG Chest Portable 1 View  Result Date: 12/20/2019 CLINICAL DATA:  Cough EXAM: PORTABLE CHEST 1 VIEW COMPARISON:  None. FINDINGS: There are subtle bilateral airspace opacities, for example in the left mid lung zone. There is no pneumothorax. No large pleural effusion. No acute osseous abnormality. The heart size is normal. IMPRESSION: Subtle airspace opacities as above, which may represent atelectasis or an  atypical infectious process in the appropriate clinical setting. Electronically Signed   By: Constance Holster M.D.   On: 12/20/2019 23:45    Time Spent in minutes  35   Jadynn Epping M.D on 12/23/2019 at 11:12 AM  Between 7am to 7pm - Pager - [phone]  After 7pm go to [email or web address] - password [facility]  [facility] -  Office  [phone]

## 2019-12-24 DIAGNOSIS — R6520 Severe sepsis without septic shock: Secondary | ICD-10-CM

## 2019-12-24 DIAGNOSIS — J9601 Acute respiratory failure with hypoxia: Secondary | ICD-10-CM

## 2019-12-24 DIAGNOSIS — A419 Sepsis, unspecified organism: Secondary | ICD-10-CM

## 2019-12-24 LAB — COMPREHENSIVE METABOLIC PANEL
ALT: 13 U/L (ref 0–44)
AST: 20 U/L (ref 15–41)
Albumin: 2.8 g/dL — ABNORMAL LOW (ref 3.5–5.0)
Alkaline Phosphatase: 64 U/L (ref 38–126)
Anion gap: 9 (ref 5–15)
BUN: 13 mg/dL (ref 6–20)
CO2: 26 mmol/L (ref 22–32)
Calcium: 8 mg/dL — ABNORMAL LOW (ref 8.9–10.3)
Chloride: 106 mmol/L (ref 98–111)
Creatinine, Ser: 0.65 mg/dL (ref 0.44–1.00)
GFR calc Af Amer: >60 mL/min (ref >60)
GFR calc non Af Amer: >60 mL/min (ref >60)
Glucose, Bld: 117 mg/dL — ABNORMAL HIGH (ref 70–99)
Potassium: 3.8 mmol/L (ref 3.5–5.1)
Sodium: 141 mmol/L (ref 135–145)
Total Bilirubin: 0.5 mg/dL (ref 0.3–1.2)
Total Protein: 5.8 g/dL — ABNORMAL LOW (ref 6.5–8.1)

## 2019-12-24 LAB — FIBRIN DERIVATIVES D-DIMER (ARMC ONLY): Fibrin derivatives D-dimer (ARMC): 336.58 ng/mL (FEU) (ref 0.00–499.00)

## 2019-12-24 LAB — CBC WITH DIFFERENTIAL/PLATELET
Abs Immature Granulocytes: 0.15 10*3/uL — ABNORMAL HIGH (ref 0.00–0.07)
Basophils Absolute: 0 10*3/uL (ref 0.0–0.1)
Basophils Relative: 0 %
Eosinophils Absolute: 0 10*3/uL (ref 0.0–0.5)
Eosinophils Relative: 0 %
HCT: 31.9 % — ABNORMAL LOW (ref 36.0–46.0)
Hemoglobin: 9.3 g/dL — ABNORMAL LOW (ref 12.0–15.0)
Immature Granulocytes: 3 %
Lymphocytes Relative: 30 %
Lymphs Abs: 1.7 10*3/uL (ref 0.7–4.0)
MCH: 21.5 pg — ABNORMAL LOW (ref 26.0–34.0)
MCHC: 29.2 g/dL — ABNORMAL LOW (ref 30.0–36.0)
MCV: 73.8 fL — ABNORMAL LOW (ref 80.0–100.0)
Monocytes Absolute: 0.5 10*3/uL (ref 0.1–1.0)
Monocytes Relative: 10 %
Neutro Abs: 3.1 10*3/uL (ref 1.7–7.7)
Neutrophils Relative %: 57 %
Platelets: 265 10*3/uL (ref 150–400)
RBC: 4.32 MIL/uL (ref 3.87–5.11)
RDW: 22.9 % — ABNORMAL HIGH (ref 11.5–15.5)
Smear Review: NORMAL
WBC: 5.5 10*3/uL (ref 4.0–10.5)
nRBC: 1.3 % — ABNORMAL HIGH (ref 0.0–0.2)

## 2019-12-24 LAB — C-REACTIVE PROTEIN: CRP: 3.1 mg/dL — ABNORMAL HIGH (ref <1.0)

## 2019-12-24 MED ORDER — ROPINIROLE HCL 0.25 MG PO TABS
0.2500 mg | ORAL_TABLET | Freq: Every day | ORAL | Status: DC
  Filled 2019-12-24: qty 1

## 2019-12-24 NOTE — Plan of Care (Addendum)
Pt reports feeling tired. Appetite minimal. Able to tolerate room air at rest but desaturates with exertion. Oxygen increased up to 2L [place] but able to titrate back to room air at rest.

## 2019-12-24 NOTE — Progress Notes (Addendum)
SLP Note  Patient Details Name: Sahniya Henneman MRN: [id number] DOB: 09/26/1981   Cancelled treatment:       Reason Eval/Treat Not Completed: (chart reviewed; NSG consulted). Pt's diet consistency has been further upgraded from Full Liquid to Soft diet per MD yesterday. Per NSG report yesterday, pt has been tolerating the oral diets w/ No overt s/s of aspiration; improved oral intake overall. Reviewed labs, temps - wnl.  Pt appears to be adequately managing her oral diet now w/ improved status; less discomfort and overall increased oral intake. No discomfort reported by NSG, notes. ST services will sign off at this time w/ NSG to reconsult if needs arise while admitted. NSG agreed.      Orinda Kenner, MS, CCC-SLP Carlye Panameno 12/24/2019, 9:50 AM

## 2019-12-24 NOTE — TOC Initial Note (Signed)
Transition of Care [facility]) - Initial/Assessment Note    Patient Details  Name: Joanna Nelson MRN: [id number] Date of Birth: May 18, 1981  Transition of Care [facility]) CM/SW Contact:    Shelbie Hutching, RN Phone Number: 12/24/2019, 11:42 AM  Clinical Narrative:                 Patient admitted with COVID, has a history of seizures.  Patient is followed outpatient by [facility] neurology for her seizure disorder and she does have a primary care physician, Dr. Lavera Guise.   No discharge needs identified at this time.   Expected Discharge Plan: Home/Self Care Barriers to Discharge: Continued Medical Work up   Patient Goals and CMS Choice        Expected Discharge Plan and Services Expected Discharge Plan: Home/Self Care       Living arrangements for the past 2 months: Single Family Home                                      Prior Living Arrangements/Services Living arrangements for the past 2 months: Single Family Home Lives with:: Spouse Patient language and need for interpreter reviewed:: Yes([place])        Need for Family Participation in Patient Care: Yes (Comment)(COVID and seizures) Care giver support system in place?: Yes (comment)(husband)   Criminal Activity/Legal Involvement Pertinent to Current Situation/Hospitalization: No - Comment as needed  Activities of Daily Living Home Assistive Devices/Equipment: None ADL Screening (condition at time of admission) Patient's cognitive ability adequate to safely complete daily activities?: Yes Is the patient deaf or have difficulty hearing?: No Does the patient have difficulty seeing, even when wearing glasses/contacts?: No Does the patient have difficulty concentrating, remembering, or making decisions?: No Patient able to express need for assistance with ADLs?: Yes Does the patient have difficulty dressing or bathing?: No Independently performs ADLs?: Yes (appropriate for developmental age) Communication:  Independent Dressing (OT): Independent Grooming: Independent Feeding: Independent Bathing: Independent Toileting: Independent In/Out Bed: Independent Walks in Home: Independent Does the patient have difficulty walking or climbing stairs?: No Weakness of Legs: None Weakness of Arms/Hands: None  Permission Sought/Granted                  Emotional Assessment   Attitude/Demeanor/Rapport: Avoidant Affect (typically observed): Quiet, Withdrawn Orientation: : Oriented to Self, Oriented to Place, Oriented to  Time, Oriented to Situation Alcohol / Substance Use: Not Applicable Psych Involvement: No (comment)  Admission diagnosis:  Sepsis [facility]) [A41.9] Patient Active Problem List   Diagnosis Date Noted  . Pneumonia due to COVID-19 virus 12/21/2019  . Acute respiratory failure with hypoxia ([place]) 12/21/2019  . Acute pharyngitis 12/21/2019  . Sepsis ([place]) 12/21/2019  . Dysphagia 12/21/2019  . 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

## 2019-12-24 NOTE — Progress Notes (Signed)
[facility] Progress Note  Patient: Joanna Nelson    [id number]  DOA: 12/20/2019     Date of Service: the patient was seen and examined on 12/24/2019  Chief Complaint  Patient presents with  . Emesis  . Sore Throat   Brief hospital course: Right hemifacial atrophy, cleaner scleroderma, right temporal cavernoma.  Presents with complaints of shortness of breath and sore throat.  Found to have acute COVID-19 illness with hypoxic respiratory failure Currently further plan is wean oxygen and monitor improvement.  Assessment and Plan: 1.  Sepsis POA due to COVID-19 illness Acute COVID-19 Viral illness Acute pharyngitis with odynophagia CXR: hazy bilateral peripheral opacities  Recent Labs    12/22/19 0508 12/23/19 0703 12/24/19 0544  CRP 5.1* 5.2* 3.1*    Tmax last 24 hours:  Temp (24hrs), Avg:98.2 F (36.8 C), Min:97.7 F (36.5 C), Max:98.7 F (37.1 C)  POC source COVID-19 antigen positive on 12/21/2019 Oxygen requirements: 86% on room air  Antibiotics: Ceftriaxone and azithromycin for 2 days Diuretics: None Vitamin C and Zinc: Continue DVT Prophylaxis: Subcutaneous Lovenox  Remdesivir: Started on 12/20/2019.  Finished today on 12/18/2019 Steroids: Decadron 12/20/2019 Actemra: Has not received off-label use of Actemra  Prone positioning: Patient encouraged to stay in prone position as much as possible.  PPE During this encounter: Patient Isolation: Airborne + Droplet + Contact HCP PPE: CAPR, gown. gloves Patient PPE: None  The treatment plan and use of medications and known side effects were discussed with patient/family. It was clearly explained that there is no proven definitive treatment for COVID-19 infection yet. Any medications used here are based on case reports/anecdotal data which are not peer-reviewed and has not been studied using randomized control trials.  Complete risks and long-term side effects are unknown, however in the best clinical judgment they  seem to be of some clinical benefit rather than medical risks.  Patient/family agree with the treatment plan and want to receive these treatments as indicated.   2.  Acute pharyngitis with odynophagia Patient reported severe pain as well as difficulty with swallowing. CT soft tissue neck without any abscess or obstruction. Rapid strep test negative. Patient was actually given antibiotics for a few days. Now that the patient is on steroids reports significant improvement and is tolerating soft diet. SLP currently signed off.  3.  Microcytic anemia. Etiology not clear. Acute on chronicity is also not clear. SP2 PRBC transfusion. Iron level normal, B12 level normal but folic acid low, continue B supplement Monitor H&H. No active bleeding. Continue PPI.  4.  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 No further seizures. Outpatient follow-up with neurology.  5.  Hyperkalemia Monitor.  6.  Obesity Body mass index is 31.98 kg/m.  Patient will benefit from outpatient dietary consultation.  Diet: Soft diet DVT Prophylaxis: Subcutaneous Lovenox   Advance goals of care discussion: Full code  Family Communication: no family was present at bedside, at the time of interview.   Disposition:  Pt is from home, admitted with hypoxia, still has hypoxia, which precludes a safe discharge. Discharge to home, when oxygenation improves.  Subjective: Denies any acute complaint.  Continues to have dizziness and lightheadedness as well as shortness of breath with exertion. No chest pain.  Still has dry cough.  Pain while swallowing is improving.  Physical Exam: General: alert oriented to time, place, and person.  Appear in mild distress, affect appropriate Eyes: PERRL ENT: Oral Mucosa Clear, moist  Neck: no JVD,  Cardiovascular: S1 and S2 Present, no Murmur,  Respiratory: good respiratory effort, Bilateral  Air entry equal and Decreased, bilateral Crackles, no wheezes Abdomen: Bowel Sound present, Soft and no tenderness,  Skin: no rash Extremities: no Pedal edema, no calf tenderness Neurologic: without any new focal findings Gait not checked due to patient safety concerns  Vitals:   12/24/19 1120 12/24/19 1200 12/24/19 1245 12/24/19 1445  BP: 127/74 (!) 149/82    Pulse: 65 65 60 66  Resp: 13 15 14 19   Temp:    98.3 F (36.8 C)  TempSrc:    Oral  SpO2: 95% 95% 96% 94%  Weight:      Height:        Intake/Output Summary (Last 24 hours) at 12/24/2019 1639 Last data filed at 12/24/2019 1400 Gross per 24 hour  Intake 530 ml  Output 1 ml  Net 529 ml   Filed Weights   12/20/19 2248 12/23/19 0700  Weight: 72.6 kg 79.3 kg    Data Reviewed: I have personally reviewed and interpreted daily labs, tele strips, imagings as discussed above. I reviewed all nursing notes, pharmacy notes, vitals, pertinent old records I have discussed plan of care as described above with RN and patient/family.  CBC: Recent Labs  Lab 12/20/19 2042 12/21/19 0545 12/22/19 0508 12/23/19 0703 12/24/19 0544  WBC 8.8  16.5* 8.3 10.5 8.3 5.5  NEUTROABS  --   --  7.0 6.1 3.1  HGB 8.2*  12.4 7.0* 9.6* 9.4* 9.3*  HCT 30.2*  41.5 26.4* 33.0* 32.9* 31.9*  MCV 67.3*  89.4 68.6* 73.5* 73.6* 73.8*  PLT 359  222 310 231 231 [id number]   Basic Metabolic Panel: Recent Labs  Lab 12/20/19 2042 12/22/19 0508 12/23/19 0703 12/24/19 0544  NA 137 142 138 141  K 5.4* 3.5 3.5 3.8  CL 99 108 104 106  CO2 23 26 28 26   GLUCOSE 119* 112* 158* 117*  BUN 7 11 11 13   CREATININE 0.87 0.73 0.67 0.65  CALCIUM 8.3* 7.9* 7.8* 8.0*    Studies: No results found.  Scheduled Meds: . albuterol  2 puff Inhalation Q6H  . vitamin C  500 mg Oral Daily  . cloBAZam  30 mg Oral QHS  . dexamethasone (DECADRON) injection  6 mg Intravenous Q24H  . dextromethorphan-guaiFENesin  1 tablet Oral BID  . divalproex  500 mg Oral QHS  .  enoxaparin (LOVENOX) injection  40 mg Subcutaneous Q24H  . folic acid  1 mg Oral Daily  . lamoTRIgine  150 mg Oral Daily  . multivitamin with minerals  1 tablet Oral Daily  . pantoprazole (PROTONIX) IV  40 mg Intravenous Q12H  . zinc sulfate  220 mg Oral Daily   Continuous Infusions: . remdesivir 100 mg in NS 100 mL 100 mg (12/24/19 0932)   PRN Meds: acetaminophen **OR** acetaminophen, chlorpheniramine-HYDROcodone, guaiFENesin-dextromethorphan, menthol-cetylpyridinium, ondansetron **OR** ondansetron (ZOFRAN) IV, phenol  Time spent: 35 minutes  Author: Berle Mull, MD [facility] 12/24/2019 4:39 PM  To reach On-call, see care teams to locate the attending and reach out to them via [email or web address]. If 7PM-7AM, please contact night-coverage If you still have difficulty reaching the attending provider, please page the [facility] (Director on Call) for [facility] on amion for assistance.

## 2019-12-25 LAB — COMPREHENSIVE METABOLIC PANEL
ALT: 16 U/L (ref 0–44)
AST: 26 U/L (ref 15–41)
Albumin: 2.8 g/dL — ABNORMAL LOW (ref 3.5–5.0)
Alkaline Phosphatase: 63 U/L (ref 38–126)
Anion gap: 14 (ref 5–15)
BUN: 13 mg/dL (ref 6–20)
CO2: 25 mmol/L (ref 22–32)
Calcium: 8.2 mg/dL — ABNORMAL LOW (ref 8.9–10.3)
Chloride: 101 mmol/L (ref 98–111)
Creatinine, Ser: 0.73 mg/dL (ref 0.44–1.00)
GFR calc Af Amer: >60 mL/min (ref >60)
GFR calc non Af Amer: >60 mL/min (ref >60)
Glucose, Bld: 154 mg/dL — ABNORMAL HIGH (ref 70–99)
Potassium: 3.5 mmol/L (ref 3.5–5.1)
Sodium: 140 mmol/L (ref 135–145)
Total Bilirubin: 0.6 mg/dL (ref 0.3–1.2)
Total Protein: 5.7 g/dL — ABNORMAL LOW (ref 6.5–8.1)

## 2019-12-25 LAB — CBC WITH DIFFERENTIAL/PLATELET
Abs Immature Granulocytes: 0.16 10*3/uL — ABNORMAL HIGH (ref 0.00–0.07)
Basophils Absolute: 0 10*3/uL (ref 0.0–0.1)
Basophils Relative: 0 %
Eosinophils Absolute: 0 10*3/uL (ref 0.0–0.5)
Eosinophils Relative: 0 %
HCT: 33.5 % — ABNORMAL LOW (ref 36.0–46.0)
Hemoglobin: 9.6 g/dL — ABNORMAL LOW (ref 12.0–15.0)
Immature Granulocytes: 3 %
Lymphocytes Relative: 25 %
Lymphs Abs: 1.4 10*3/uL (ref 0.7–4.0)
MCH: 21.2 pg — ABNORMAL LOW (ref 26.0–34.0)
MCHC: 28.7 g/dL — ABNORMAL LOW (ref 30.0–36.0)
MCV: 74.1 fL — ABNORMAL LOW (ref 80.0–100.0)
Monocytes Absolute: 0.4 10*3/uL (ref 0.1–1.0)
Monocytes Relative: 7 %
Neutro Abs: 3.7 10*3/uL (ref 1.7–7.7)
Neutrophils Relative %: 65 %
Platelets: 284 10*3/uL (ref 150–400)
RBC: 4.52 MIL/uL (ref 3.87–5.11)
RDW: 23.7 % — ABNORMAL HIGH (ref 11.5–15.5)
WBC: 5.7 10*3/uL (ref 4.0–10.5)
nRBC: 1.1 % — ABNORMAL HIGH (ref 0.0–0.2)

## 2019-12-25 LAB — C-REACTIVE PROTEIN: CRP: 1.4 mg/dL — ABNORMAL HIGH (ref <1.0)

## 2019-12-25 LAB — FIBRIN DERIVATIVES D-DIMER (ARMC ONLY): Fibrin derivatives D-dimer (ARMC): 420.47 ng/mL (FEU) (ref 0.00–499.00)

## 2019-12-25 MED ORDER — DEXAMETHASONE 6 MG PO TABS: 6.0000 mg | ORAL_TABLET | Freq: Every day | ORAL | 0 refills | Status: AC

## 2019-12-25 MED ORDER — ZINC SULFATE 220 (50 ZN) MG PO CAPS: 220.0000 mg | ORAL_CAPSULE | Freq: Every day | ORAL | 0 refills | Status: AC

## 2019-12-25 MED ORDER — ASCORBIC ACID 500 MG PO TABS: 500.0000 mg | ORAL_TABLET | Freq: Every day | ORAL | 0 refills | Status: AC

## 2019-12-25 MED ORDER — PANTOPRAZOLE SODIUM 40 MG PO TBEC: 40.0000 mg | DELAYED_RELEASE_TABLET | Freq: Every day | ORAL | 0 refills | Status: AC

## 2019-12-25 MED ORDER — FOLIC ACID 1 MG PO TABS: 1.0000 mg | ORAL_TABLET | Freq: Every day | ORAL | 0 refills | Status: AC

## 2019-12-25 NOTE — Progress Notes (Addendum)
Discharge instructions reviewed with patient using teachback, questions  encouraged and answered. Pt husband waiting outside lobby for patient . IV removed. Transferred via [facility] in stable condition. Quarantine instructions reviewed using teachback.

## 2019-12-25 NOTE — TOC Transition Note (Signed)
Transition of Care [facility]) - CM/SW Discharge Note   Patient Details  Name: Joanna Nelson MRN: [id number] Date of Birth: 31-Dec-1980  Transition of Care [facility]) CM/SW Contact:  Shelbie Hutching, RN Phone Number: 12/25/2019, 10:00 AM   Clinical Narrative:    RNCM spoke with patient about how she felt about discharging home today.  Patient states she feels "okay" about it.  Patient reports no safety concerns about returning home other than that she lives with some older people and she is concerned about them getting COVID.  RNCM explained to patient that she will just have to stay in her room and quarantine from the rest of the family, patient says this should not be a problem.  Patient reports that her husband can pick her up this afternoon.  No discharge needs identified, TOC team signing off.    Final next level of care: Home/Self Care Barriers to Discharge: Barriers Resolved   Patient Goals and CMS Choice        Discharge Placement                       Discharge Plan and Services                                     Social Determinants of Health (SDOH) Interventions     Readmission Risk Interventions No flowsheet data found.

## 2019-12-26 LAB — CULTURE, BLOOD (ROUTINE X 2)
Culture: NO GROWTH
Culture: NO GROWTH
Special Requests: ADEQUATE

## 2019-12-29 NOTE — Discharge Summary (Signed)
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 PCP: Follow-up in 1 week 2. Follow up LABS/TEST: Repeat CBC, may require outpatient GI referral.  Follow-up Information    Cletis Athens, MD. Schedule an appointment as soon as possible for a visit in 1 week(s).   Specialty: Internal Medicine Contact information: [address] [place] [address] [phone]          Diet recommendation: Cardiac diet  Activity: The patient is advised to gradually reintroduce usual activities, as tolerated  Discharge Condition: stable  Code Status: Full code   History of present illness: As per the H and P dictated on admission, "Joanna Nelson is a 40 y.o. female with medical history significant for complex partial seizures, progressive right hemifacial atrophy /linear scleroderma as well as a known right temporal lobe cavernoma, who presents to the emergency room with a 2-day history of sore throat resulting in inability to swallow both solids and liquids.  She also has a cough fever.  She is unable to speak on account of the sore throat.  Patient speaks in a whisper but mostly nods and shakes her head to the questions asked.  She denies chest pain, change in bowel habits or abdominal pain and denies dysuria.  Denies headache or visual disturbance or numbness tingling and weakness in the extremity.  She does have a baseline right upper extremity weakness based on past documentation. ED Course: On arrival in the emergency room she was febrile  with a temperature of 103.2, tachycardic at 125, initially O2 sat was 95% on room air but then dropped to 88% and she was placed on oxygen.  Blood pressure was 123/75.,  White cell count was initially reported as 16,000 but this was reportedly an area and her white cell count is actually 8000.  Hemoglobin is low at 8.2, though originally reported as 12.4.  Lactic acid was 1.8.  Rapid Covid done in the emergency room, per ER physician, was positive, though not yet resulted in the record at the time of this dictation.  Chest x-ray showed subtle airspace opacities which may represent atelectasis or an atypical infectious process in the appropriate clinical setting.  EKG showed sinus tachycardia.  Analysis unremarkable.  She was started on treatment for sepsis secondary to pneumonia as well as Covid treatment.  Hospitalist consulted for admission.  CT soft tissue neck was requested based on degree of aphasia patient is experiencing and result is pending at the time of admission."  Hospital Course:  Summary of her active problems in the hospital is as following.   1.  Sepsis POA due to COVID-19 illness Acute COVID-19 Viral illness Acute pharyngitis with odynophagia CXR: hazy bilateral peripheral opacities Tmax last 24 hours:  Temp (24hrs), Avg:98.2 F (36.8 C), Min:97.7 F (36.5 C), Max:98.7 F (37.1 C) POC source COVID-19 antigen positive on 12/21/2019 Oxygen requirements: on room air Antibiotics: Ceftriaxone and azithromycin for 2 days Diuretics: None Vitamin C and Zinc: Continue DVT Prophylaxis: Subcutaneous Lovenox  Remdesivir: Started on 12/20/2019.  Finished  on 12/25/2019 Steroids: Decadron 12/20/2019 Actemra: Has not received off-label use of Actemra  Prone positioning: Patient encouraged to stay in prone position as much as possible.  PPE During this encounter: Patient Isolation: Airborne + Droplet + Contact HCP PPE: CAPR, gown. gloves Patient PPE: None  The treatment plan and use of  medications and known side effects were discussed with patient/family. It was clearly explained that there is no proven definitive treatment for COVID-19 infection yet. Any medications used here are based on case reports/anecdotal data which are not peer-reviewed and has not been studied using randomized control trials.  Complete risks and long-term side effects are unknown, however in the best clinical judgment they seem to be of some clinical benefit rather than medical risks.  Patient/family agree with the treatment plan and want to receive these treatments as indicated.   2.  Acute pharyngitis with odynophagia Patient reported severe pain as well as difficulty with swallowing. CT soft tissue neck without any abscess or obstruction. Rapid strep test negative. Patient was actually given antibiotics for a few days. Now that the patient is on steroids reports significant improvement and is tolerating soft diet. SLP currently signed off.  3.  Microcytic anemia. Etiology not clear. Acute on chronicity is also not clear. SP2 PRBC transfusion. Iron level normal, B12 level normal but folic acid low, continue B supplement No active bleeding. Continue PPI.  4.  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 No further seizures. Outpatient follow-up with neurology.  5.  Hyperkalemia Resolved.  6.  Obesity Body mass index is 31.98 kg/m.  Patient will benefit from outpatient dietary consultation.   Patient was ambulatory without any assistance. On the day of the discharge the patient's vitals were stable, and no other acute medical condition were reported by patient. the patient was felt safe to be discharge at Home with no therapy needed on discharge.  Consultants: none Procedures: none  Discharge Exam: General: Appear in no distress, no Rash; Oral Mucosa Clear, moist. Cardiovascular: S1 and S2 Present,  no Murmur, Respiratory: normal respiratory effort, Bilateral Air entry present and no Crackles, no wheezes Abdomen: Bowel Sound present, Soft and no tenderness, no hernia Extremities: no Pedal edema, no calf tenderness Neurology: alert and oriented to time, place, and person affect appropriate.  Filed Weights   12/20/19 2248 12/23/19 0700  Weight: 72.6 kg 79.3 kg   Vitals:   12/25/19 0033 12/25/19 0740  BP:  (!) 157/81  Pulse: 66 65  Resp: 20 20  Temp:  98.2 F (36.8 C)  SpO2: 96% 90%    DISCHARGE MEDICATION: Allergies as of 12/25/2019   No Known Allergies     Medication List    TAKE these medications   ascorbic acid 500 MG tablet Commonly known as: VITAMIN C Take 1 tablet (500 mg total) by mouth daily.   cloBAZam 10 MG tablet Commonly known as: ONFI Take 30 mg by mouth at bedtime.   dexamethasone 6 MG tablet Commonly known as: DECADRON Take 1 tablet (6 mg total) by mouth daily for 5 days.   divalproex 500 MG 24 hr tablet Commonly known as: DEPAKOTE ER Take 500 mg by mouth at bedtime.   folic acid 1 MG tablet Commonly known as: FOLVITE Take 1 tablet (1 mg total) by mouth daily.   lamoTRIgine 150 MG tablet Commonly known as: LAMICTAL Take 150 mg by mouth daily.   pantoprazole 40 MG tablet Commonly known as:  Protonix Take 1 tablet (40 mg total) by mouth daily for 14 days.   zinc sulfate 220 (50 Zn) MG capsule Take 1 capsule (220 mg total) by mouth daily.      No Known Allergies Discharge Instructions    MyChart COVID-19 home monitoring program   Complete by: Dec 25, 2019    Is the patient willing to use the [place] for home monitoring?: No   Diet - low sodium heart healthy   Complete by: As directed    Increase activity slowly   Complete by: As directed       The results of significant diagnostics from this hospitalization (including imaging, microbiology, ancillary and laboratory) are listed below for reference.    Significant  Diagnostic Studies: CT Soft Tissue Neck W Contrast  Result Date: 12/21/2019 CLINICAL DATA:  Vomiting and sore throat EXAM: CT NECK WITH CONTRAST TECHNIQUE: Multidetector CT imaging of the neck was performed using the standard protocol following the bolus administration of intravenous contrast. CONTRAST:  59mL OMNIPAQUE IOHEXOL 300 MG/ML  SOLN COMPARISON:  None. FINDINGS: Pharynx and larynx: --Nasopharynx: Fossae of Rosenmuller are clear. Normal adenoid tonsils for age. --Oral cavity and oropharynx: No visible lesion of the tongue or floor of mouth. Normal lingual and palatine tonsils. Oropharynx is clear. --Hypopharynx: Normal vallecula and pyriform sinuses. --Larynx: Normal epiglottis and pre-epiglottic space. Normal aryepiglottic and vocal folds. --Retropharyngeal space: No abscess, effusion or lymphadenopathy. Salivary glands: No right parotid gland is visualized. There is compensatory hypertrophy of the left parotid gland. Diminutive right submandibular gland. Thyroid: Normal. Lymph nodes: None enlarged or abnormal density. Vascular: Negative. Limited intracranial: Negative. Visualized orbits: Asymmetric but otherwise normal Mastoids and visualized paranasal sinuses: Clear. Skeleton: No acute or aggressive process. Upper chest: Negative. Other: Facial asymmetry with atrophy of the right 10 paralysis and masseter muscles. IMPRESSION: 1. No acute abnormality of the neck. 2. 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 3. Facial asymmetry with atrophy of the right temporalis and masseter muscles. Diminutive right orbit. Electronically Signed   By: Ulyses Jarred M.D.   On: 12/21/2019 02:15   DG Chest Portable 1 View  Result Date: 12/20/2019 CLINICAL DATA:  Cough EXAM: PORTABLE CHEST 1 VIEW COMPARISON:  None. FINDINGS: There are subtle bilateral airspace opacities, for example in the left mid lung zone. There is no pneumothorax. No large pleural effusion. No acute  osseous abnormality. The heart size is normal. IMPRESSION: Subtle airspace opacities as above, which may represent atelectasis or an atypical infectious process in the appropriate clinical setting. Electronically Signed   By: Constance Holster M.D.   On: 12/20/2019 23:45    Microbiology: Recent Results (from the past 240 hour(s))  Group A Strep by PCR ([place] Only)     Status: None   Collection Time: 12/20/19 11:52 PM   Specimen: Throat; Sterile Swab  Result Value Ref Range Status   Group A Strep by PCR NOT DETECTED NOT DETECTED Final    Comment: Performed at [facility], [address] [place], [place] [address]  Blood Culture (routine x 2)     Status: None   Collection Time: 12/21/19 12:36 AM   Specimen: Left Antecubital; Blood  Result Value Ref Range Status   Specimen Description LEFT ANTECUBITAL  Final   Special Requests   Final    Blood Culture results may not be optimal due to an excessive volume of blood received in culture bottles   Culture  Final    NO GROWTH 5 DAYS Performed at [facility], [address] [place], [place] [address]    Report Status 12/26/2019 FINAL  Final  Urine culture     Status: Abnormal   Collection Time: 12/21/19 12:40 AM   Specimen: In/Out Cath Urine  Result Value Ref Range Status   Specimen Description   Final    IN/OUT CATH URINE Performed at [facility], [address] [place], [place] [address]    Special Requests   Final    NONE Performed at [facility], [address] [place], [place] [address]    Culture MULTIPLE SPECIES PRESENT, SUGGEST RECOLLECTION (A)  Final   Report Status 12/22/2019 FINAL  Final  Blood Culture (routine x 2)     Status: None   Collection Time: 12/21/19  1:02 AM   Specimen: BLOOD  Result Value Ref Range Status   Specimen Description BLOOD RIGHT WRIST  Final   Special Requests   Final    BOTTLES DRAWN AEROBIC AND ANAEROBIC Blood Culture adequate volume   Culture    Final    NO GROWTH 5 DAYS Performed at [facility], [address] [place],  [address]    Report Status 12/26/2019 FINAL  Final     Labs: CBC: Recent Labs  Lab 12/23/19 0703 12/24/19 0544 12/25/19 0241  WBC 8.3 5.5 5.7  NEUTROABS 6.1 3.1 3.7  HGB 9.4* 9.3* 9.6*  HCT 32.9* 31.9* 33.5*  MCV 73.6* 73.8* 74.1*  PLT 231 265 [id number]   Basic Metabolic Panel: Recent Labs  Lab 12/23/19 0703 12/24/19 0544 12/25/19 0241  NA 138 141 140  K 3.5 3.8 3.5  CL 104 106 101  CO2 28 26 25   GLUCOSE 158* 117* 154*  BUN 11 13 13   CREATININE 0.67 0.65 0.73  CALCIUM 7.8* 8.0* 8.2*   Liver Function Tests: Recent Labs  Lab 12/23/19 0703 12/24/19 0544 12/25/19 0241  AST 25 20 26   ALT 14 13 16   ALKPHOS 64 64 63  BILITOT 0.8 0.5 0.6  PROT 5.8* 5.8* 5.7*  ALBUMIN 2.6* 2.8* 2.8*   No results for input(s): LIPASE, AMYLASE in the last 168 hours. No results for input(s): AMMONIA in the last 168 hours. Cardiac Enzymes: No results for input(s): CKTOTAL, CKMB, CKMBINDEX, TROPONINI in the last 168 hours. BNP (last 3 results) No results for input(s): BNP in the last 8760 hours. CBG: No results for input(s): GLUCAP in the last 168 hours.  Time spent: 35 minutes  Signed:  Berle Mull  [facility] 12/25/2019 7:58 AM

## 2020-01-17 ENCOUNTER — Ambulatory Visit: Payer: 59 | Admitting: Cardiovascular Disease

## 2020-08-12 ENCOUNTER — Emergency Department (HOSPITAL_COMMUNITY)
Admission: EM | Admit: 2020-08-12 | Discharge: 2020-08-13 | Disposition: A | Discharge status: Home / Self Care | Payer: 59 | Attending: Emergency Medicine | Admitting: Emergency Medicine

## 2020-08-12 DIAGNOSIS — R569 Unspecified convulsions: Secondary | ICD-10-CM | POA: Insufficient documentation

## 2020-08-12 DIAGNOSIS — R5383 Other fatigue: Secondary | ICD-10-CM | POA: Diagnosis not present

## 2020-08-12 DIAGNOSIS — W19XXXA Unspecified fall, initial encounter: Secondary | ICD-10-CM | POA: Insufficient documentation

## 2020-08-12 DIAGNOSIS — R930 Abnormal findings on diagnostic imaging of skull and head, not elsewhere classified: Secondary | ICD-10-CM | POA: Insufficient documentation

## 2020-08-12 DIAGNOSIS — R519 Headache, unspecified: Secondary | ICD-10-CM | POA: Diagnosis not present

## 2020-08-12 DIAGNOSIS — Z20822 Contact with and (suspected) exposure to covid-19: Secondary | ICD-10-CM | POA: Diagnosis not present

## 2020-08-12 DIAGNOSIS — R509 Fever, unspecified: Secondary | ICD-10-CM | POA: Insufficient documentation

## 2020-08-12 DIAGNOSIS — Z79899 Other long term (current) drug therapy: Secondary | ICD-10-CM | POA: Insufficient documentation

## 2020-08-12 DIAGNOSIS — S0181XA Laceration without foreign body of other part of head, initial encounter: Secondary | ICD-10-CM | POA: Insufficient documentation

## 2020-08-12 DIAGNOSIS — M791 Myalgia, unspecified site: Secondary | ICD-10-CM | POA: Insufficient documentation

## 2020-08-12 DIAGNOSIS — R55 Syncope and collapse: Secondary | ICD-10-CM | POA: Diagnosis not present

## 2020-08-12 DIAGNOSIS — S0990XA Unspecified injury of head, initial encounter: Secondary | ICD-10-CM | POA: Diagnosis present

## 2020-08-12 DIAGNOSIS — N3000 Acute cystitis without hematuria: Secondary | ICD-10-CM

## 2020-08-12 HISTORY — DX: Unspecified convulsions: R56.9

## 2020-08-12 NOTE — ED Triage Notes (Signed)
Pt comes via [facility] after a fall, pt reports that she for dizzy and fell, pt hit her head, hx of seizures, laceration to R eye, and bruising noted, pt has deformity to R forehead and eye that is normal, pt also has had several syncopal episode over the last few days and changes to her meds.

## 2020-08-13 ENCOUNTER — Emergency Department (HOSPITAL_COMMUNITY): Payer: 59

## 2020-08-13 ENCOUNTER — Encounter (HOSPITAL_COMMUNITY): Payer: Self-pay

## 2020-08-13 LAB — URINALYSIS, ROUTINE W REFLEX MICROSCOPIC
Bilirubin Urine: NEGATIVE
Glucose, UA: NEGATIVE mg/dL
Ketones, ur: NEGATIVE mg/dL
Nitrite: POSITIVE — AB
Protein, ur: NEGATIVE mg/dL
Specific Gravity, Urine: 1.013 (ref 1.005–1.030)
pH: 6 (ref 5.0–8.0)

## 2020-08-13 LAB — CBC
HCT: 39.5 % (ref 36.0–46.0)
Hemoglobin: 11.6 g/dL — ABNORMAL LOW (ref 12.0–15.0)
MCH: 22.4 pg — ABNORMAL LOW (ref 26.0–34.0)
MCHC: 29.4 g/dL — ABNORMAL LOW (ref 30.0–36.0)
MCV: 76.3 fL — ABNORMAL LOW (ref 80.0–100.0)
Platelets: 390 10*3/uL (ref 150–400)
RBC: 5.18 MIL/uL — ABNORMAL HIGH (ref 3.87–5.11)
RDW: 15.9 % — ABNORMAL HIGH (ref 11.5–15.5)
WBC: 5.7 10*3/uL (ref 4.0–10.5)
nRBC: 0 % (ref 0.0–0.2)

## 2020-08-13 LAB — BASIC METABOLIC PANEL
Anion gap: 13 (ref 5–15)
BUN: 8 mg/dL (ref 6–20)
CO2: 22 mmol/L (ref 22–32)
Calcium: 9 mg/dL (ref 8.9–10.3)
Chloride: 99 mmol/L (ref 98–111)
Creatinine, Ser: 0.97 mg/dL (ref 0.44–1.00)
GFR calc Af Amer: >60 mL/min (ref >60)
GFR calc non Af Amer: >60 mL/min (ref >60)
Glucose, Bld: 116 mg/dL — ABNORMAL HIGH (ref 70–99)
Potassium: 4.1 mmol/L (ref 3.5–5.1)
Sodium: 134 mmol/L — ABNORMAL LOW (ref 135–145)

## 2020-08-13 LAB — VALPROIC ACID LEVEL: Valproic Acid Lvl: 70 ug/mL (ref 50.0–100.0)

## 2020-08-13 LAB — SARS CORONAVIRUS 2 BY RT PCR (HOSPITAL ORDER, PERFORMED IN ~~LOC~~ HOSPITAL LAB): SARS Coronavirus 2: NEGATIVE

## 2020-08-13 LAB — I-STAT BETA HCG BLOOD, ED (MC, WL, AP ONLY): I-stat hCG, quantitative: <5 m[IU]/mL (ref <5)

## 2020-08-13 MED ORDER — LIDOCAINE-EPINEPHRINE 1 %-1:100000 IJ SOLN
10.0000 mL | Freq: Once | INTRAMUSCULAR | Status: AC
  Administered 2020-08-13: 10 mL
  Filled 2020-08-13: qty 1

## 2020-08-13 MED ORDER — GADOBUTROL 1 MMOL/ML IV SOLN
7.0000 mL | Freq: Once | INTRAVENOUS | Status: AC | PRN
  Administered 2020-08-13: 7 mL via INTRAVENOUS

## 2020-08-13 MED ORDER — CEPHALEXIN 500 MG PO CAPS: 500.0000 mg | ORAL_CAPSULE | Freq: Four times a day (QID) | ORAL | 0 refills | Status: AC

## 2020-08-13 MED ORDER — SODIUM CHLORIDE 0.9 % IV SOLN
1.0000 g | Freq: Once | INTRAVENOUS | Status: AC
  Administered 2020-08-13: 1 g via INTRAVENOUS
  Filled 2020-08-13: qty 10

## 2020-08-13 NOTE — ED Provider Notes (Signed)
Pt taken over by me at shift change.  Patient was evaluated by PT.  They recommend a wheelchair which I ordered.  Home health ordered. Pt is stable for d/c to home.   Isla Pence, MD 08/13/20 1954

## 2020-08-13 NOTE — ED Notes (Signed)
Attempted to stand pt x 2 to ambulate to BR. Pt swaying when sitting up then falls completely supine on stretcher when she attempts to stand. Pt states these types of falls is what occurs at home.  Pt assisted on/off bed pan.

## 2020-08-13 NOTE — ED Provider Notes (Addendum)
..  Laceration Repair  Date/Time: 08/13/2020 4:54 AM Performed by: Emeline Darling, PA-C Authorized by: Emeline Darling, PA-C   Consent:    Consent obtained:  Verbal   Consent given by:  Patient   Risks discussed:  Infection, need for additional repair, pain, poor cosmetic result and poor wound healing   Alternatives discussed:  No treatment and delayed treatment Universal protocol:    Procedure explained and questions answered to patient or proxy's satisfaction: yes     Relevant documents present and verified: yes     Test results available and properly labeled: yes     Imaging studies available: yes     Required blood products, implants, devices, and special equipment available: yes     Site/side marked: yes     Immediately prior to procedure, a time out was called: yes     Patient identity confirmed:  Verbally with patient Anesthesia (see MAR for exact dosages):    Anesthesia method:  Local infiltration   Local anesthetic:  Lidocaine 1% WITH epi Laceration details:    Location:  Face   Face location:  R eyebrow   Length (cm):  2.5   Laceration depth: superficial. Repair type:    Repair type:  Simple Pre-procedure details:    Preparation:  Patient was prepped and draped in usual sterile fashion Exploration:    Hemostasis achieved with:  Direct pressure   Wound exploration: entire depth of wound probed and visualized     Wound extent: no fascia violation noted, no foreign bodies/material noted and no underlying fracture noted     Contaminated: no   Treatment:    Area cleansed with:  Saline and Hibiclens   Amount of cleaning:  Standard   Irrigation solution:  Sterile saline   Irrigation volume:  20 ml   Irrigation method:  Syringe Skin repair:    Repair method:  Sutures   Suture size:  5-0   Suture material:  Prolene   Suture technique:  Simple interrupted   Number of sutures:  2 Approximation:    Approximation:  Close Post-procedure details:    Dressing:   Open (no dressing)   Patient tolerance of procedure:  Tolerated well, no immediate complications     Emeline Darling, PA-C 08/13/20 0500    Deloma Spindle, Gypsy Balsam, PA-C 08/13/20 0503    Orpah Greek, MD 08/13/20 0174    Orpah Greek, MD 08/13/20 [phone]

## 2020-08-13 NOTE — ED Notes (Signed)
Pt sleeping. 
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
## 2020-08-13 NOTE — Progress Notes (Signed)
Pt is assessed to move to side of bed and stand, sidestep and return to bed.  Her ability to scoot back onto a high surface is good, but has significant complaints of dizziness with all mobility.  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  Follow acutely for strength, balance, control of posture and standing endurance.  08/13/20 1600  PT Visit Information  Last PT Received On 08/13/20  Assistance Needed +1 (2 if able to walk farther)  History of Present Illness 39 yo female with onset of a fall at home was admitted to ED.  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  PMHx:  cervical spondylosis, falls, seizures, C5-6 foraminal narrowing  Precautions  Precautions Fall  Precaution Comments monitor O2 sats  Restrictions  Weight Bearing Restrictions No  Home Living  Family/patient expects to be discharged to: Private residence  Living Arrangements Spouse/significant other  Available Help at Discharge Family;Available 24 hours/day  Type of [place] Access Level entry  Home Layout One level  Tax adviser - 2 wheels  Prior Function  Level of Independence Independent with assistive device(s)  Comments has been walking with RW without help  Communication  Communication No difficulties  Pain Assessment  Pain Assessment No/denies pain  Cognition  Arousal/Alertness Awake/alert  Behavior During Therapy Impulsive  Overall Cognitive Status Within Functional Limits for tasks assessed  Upper Extremity Assessment  Upper Extremity Assessment Overall WFL for tasks assessed  Lower Extremity Assessment  Lower Extremity Assessment  (inconsistent mm tests)  Cervical / Trunk Assessment  Cervical / Trunk Assessment Normal  Bed Mobility  Overal bed mobility Modified Independent   Transfers  Overall transfer level Needs assistance  Equipment used Rolling walker (2 wheeled);1 person hand held assist  Transfers Sit to/from Stand  Sit to Stand Min guard  General transfer comment min guard to stand and then has postural shifts that are inconsistent  Ambulation/Gait  Ambulation/Gait assistance Min guard  Gait Distance (Feet) 8 Feet  Assistive device Rolling walker (2 wheeled);1 person hand held assist  Gait Pattern/deviations Step-to pattern;Shuffle;Decreased stride length  General Gait Details sidestepped side of bed as pt had inconsistent balance changes with LE's  Gait velocity reduced  Balance  Overall balance assessment Needs assistance;History of Falls  Sitting-balance support Feet supported;Bilateral upper extremity supported  Sitting balance-Leahy Scale Good  Sitting balance - Comments able to balance on side of bed  Standing balance-Leahy Scale Fair  Standing balance comment Fair balance but then has times with postural shifts  General Comments  General comments (skin integrity, edema, etc.) pt is not able to stand and walk with consistent performance, has moments where she is reporting a great deal of dizziness and then sits down.  Able to lift up enough to scoot backward onto gurney  PT - End of Session  Equipment Utilized During Treatment Gait belt  Activity Tolerance Other (comment);Treatment limited secondary to medical complications (Comment) (reports of medication related dizziness)  Patient left in bed;with call bell/phone within reach  Nurse Communication Mobility status  PT Assessment  PT Recommendation/Assessment Patient needs continued PT services  PT Visit Diagnosis Unsteadiness on feet (R26.81);Muscle weakness (generalized) (M62.81);History of falling (Z91.81);Difficulty in walking, not elsewhere classified (R26.2);Dizziness and giddiness (R42)  PT Problem List Decreased strength;Decreased activity tolerance;Decreased balance;Decreased  mobility;Decreased coordination;Decreased knowledge of use of DME;Decreased safety awareness  Barriers to Discharge Comments per her report, husband cannot assist her for safety with mobility  PT Plan  PT Frequency (ACUTE ONLY) Min 3X/week  PT Treatment/Interventions (ACUTE ONLY) DME instruction;Gait training;Functional mobility training;Therapeutic activities;Therapeutic exercise;Balance training;Neuromuscular re-education;Patient/family education  AM-PAC PT "6 Clicks" Mobility Outcome Measure (Version 2)  Help needed turning from your back to your side while in a flat bed without using bedrails? 4  Help needed moving from lying on your back to sitting on the side of a flat bed without using bedrails? 3  Help needed moving to and from a bed to a chair (including a wheelchair)? 3  Help needed standing up from a chair using your arms (e.g., wheelchair or bedside chair)? 3  Help needed to walk in hospital room? 3  Help needed climbing 3-5 steps with a railing?  1  6 Click Score 17  Consider Recommendation of Discharge To: Home with [facility]  PT Recommendation  Follow Up Recommendations Home health PT;Supervision for mobility/OOB;Supervision/Assistance - 24 hour  PT equipment Wheelchair (measurements PT);Wheelchair cushion (measurements PT)  Individuals Consulted  Consulted and Agree with Results and Recommendations Patient  Acute Rehab PT Goals  Patient Stated Goal to avoid another fall  PT Goal Formulation With patient  Time For Goal Achievement 08/27/20  Potential to Achieve Goals Good  PT Time Calculation  PT Start Time (ACUTE ONLY) 1501  PT Stop Time (ACUTE ONLY) 1526  PT Time Calculation (min) (ACUTE ONLY) 25 min  PT General Charges  $$ ACUTE PT VISIT 1 Visit  PT Evaluation  $PT Eval Moderate Complexity 1 Mod  PT Treatments  $Therapeutic Activity 8-22 mins  Written Expression  Dominant Hand Right    Mee Hives, PT MS Acute Rehab Dept. Number: [place]

## 2020-08-13 NOTE — ED Provider Notes (Signed)
[facility] EMERGENCY DEPARTMENT Provider Note   CSN: [id number] Arrival date & time: 08/12/20  2352     History Chief Complaint  Patient presents with  . Dizziness  . Fall    Joanna Nelson is a 39 y.o. female with a hx of Seizures, cavernous angioma presents to the Emergency Department complaining of recurrent syncope onset several days ago.  Patient reports associated subjective fevers, laceration, body aches, nausea and vomiting.  Patient reports she feels unwell but is unable to provide additional information.  Requested to call her husband.  Level 5 caveat for condition of patient.  Records reviewed.  Patient was admitted for sepsis secondary to Covid in January 2021.  She remains unvaccinated.  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  She has a history of a cavernous angioma and linear scleroderma.     The history is provided by the patient and medical records. No language interpreter was used.       Past Medical History:  Diagnosis Date  . Epilepsy ([place])   . Seizures [facility])     Patient Active Problem List   Diagnosis Date Noted  . Pneumonia due to COVID-19 virus 12/21/2019  . Acute respiratory failure with hypoxia ([place]) 12/21/2019  . Acute pharyngitis 12/21/2019  . Sepsis ([place]) 12/21/2019  . Dysphagia 12/21/2019  . 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 No pertinent surgical history.   OB History   No obstetric history on file.     No family history on file.  Social History   Tobacco Use  . Smoking status: Never Smoker  . Smokeless tobacco: Never Used  Substance Use Topics  . Alcohol use: Never  . Drug use: Never    Home Medications Prior to Admission medications   Medication Sig Start Date End Date Taking? Authorizing Provider  ascorbic acid (VITAMIN C) 500 MG tablet Take 1 tablet  (500 mg total) by mouth daily. 12/26/19   Lavina Hamman, MD  cloBAZam (ONFI) 10 MG tablet Take 30 mg by mouth at bedtime. 08/24/19   [provider]  divalproex (DEPAKOTE ER) 500 MG 24 hr tablet Take 500 mg by mouth at bedtime. 11/25/19   [provider]  folic acid (FOLVITE) 1 MG tablet Take 1 tablet (1 mg total) by mouth daily. 12/26/19   Lavina Hamman, MD  lamoTRIgine (LAMICTAL) 150 MG tablet Take 150 mg by mouth daily. 12/12/19   [provider]  pantoprazole (PROTONIX) 40 MG tablet Take 1 tablet (40 mg total) by mouth daily for 14 days. 12/25/19 01/08/20  Lavina Hamman, MD  zinc sulfate 220 (50 Zn) MG capsule Take 1 capsule (220 mg total) by mouth daily. 12/26/19   Lavina Hamman, MD    Allergies    Patient has no known allergies.  Review of Systems   Review of Systems  Constitutional: Positive for fatigue and fever. Negative for appetite change, diaphoresis and unexpected weight change.  HENT: Negative for mouth sores.   Eyes: Negative for visual disturbance.  Respiratory: Negative for cough, chest tightness, shortness of breath and wheezing.   Cardiovascular: Negative for chest pain.  Gastrointestinal: Negative for abdominal pain, constipation, diarrhea, nausea and vomiting.  Endocrine: Negative for polydipsia, polyphagia and polyuria.  Genitourinary: Negative for dysuria, frequency, hematuria and urgency.  Musculoskeletal: Positive for myalgias. Negative for back pain and neck stiffness.  Skin:  Positive for wound. Negative for rash.  Allergic/Immunologic: Negative for immunocompromised state.  Neurological: Positive for syncope and headaches. Negative for light-headedness.  Hematological: Does not bruise/bleed easily.  Psychiatric/Behavioral: Negative for sleep disturbance. The patient is not nervous/anxious.     Physical Exam Updated Vital Signs BP 127/85 (BP Location: Right Arm)   Pulse 91   Temp 98.4 F (36.9 C) (Oral)   Resp 16   SpO2 98%    Physical Exam Vitals and nursing note reviewed.  Constitutional:      General: She is not in acute distress.    Appearance: She is not diaphoretic.  HENT:     Head: Normocephalic.      Comments: Significant ecchymosis surrounding the laceration to the right eyebrow.  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 Eyes:     General: No scleral icterus.    Conjunctiva/sclera: Conjunctivae normal.  Cardiovascular:     Rate and Rhythm: Normal rate and regular rhythm.     Pulses: Normal pulses.          Radial pulses are 2+ on the right side and 2+ on the left side.  Pulmonary:     Effort: No tachypnea, accessory muscle usage, prolonged expiration, respiratory distress or retractions.     Breath sounds: No stridor.     Comments: Equal chest rise. No increased work of breathing. Abdominal:     General: There is no distension.     Palpations: Abdomen is soft.     Tenderness: There is no abdominal tenderness. There is no guarding or rebound.  Musculoskeletal:     Cervical back: Normal range of motion.     Comments: Moves all extremities equally and without difficulty.  Skin:    General: Skin is warm and dry.     Capillary Refill: Capillary refill takes less than 2 seconds.  Neurological:     Mental Status: She is alert.     GCS: GCS eye subscore is 4. GCS verbal subscore is 5. GCS motor subscore is 6.     Comments: Speech is clear and goal oriented.  Psychiatric:        Mood and Affect: Mood normal.     ED Results / Procedures / Treatments   Labs (all labs ordered are listed, but only abnormal results are displayed) Labs Reviewed  BASIC METABOLIC PANEL - Abnormal; Notable for the following components:      Result Value   Sodium 134 (*)    Glucose, Bld 116 (*)    All other components within normal limits  CBC - Abnormal; Notable for the following components:   RBC 5.18 (*)    Hemoglobin 11.6 (*)    MCV 76.3 (*)    MCH  22.4 (*)    MCHC 29.4 (*)    RDW 15.9 (*)    All other components within normal limits  URINALYSIS, ROUTINE W REFLEX MICROSCOPIC - Abnormal; Notable for the following components:   APPearance HAZY (*)    Hgb urine dipstick MODERATE (*)    Nitrite POSITIVE (*)    Leukocytes,Ua SMALL (*)    Bacteria, UA RARE (*)    All other components within normal limits  SARS CORONAVIRUS 2 BY RT PCR (HOSPITAL ORDER, [facility])  URINE CULTURE  VALPROIC ACID LEVEL  LAMOTRIGINE LEVEL  CBG MONITORING, ED  I-STAT BETA HCG BLOOD, ED ([facility], [facility], [facility] ONLY)    EKG EKG Interpretation  Date/Time:  Thursday August 12 2020 23:58:22 EDT Ventricular Rate:  95 PR Interval:  144 QRS Duration: 66 QT Interval:  344 QTC Calculation: 432 R Axis:   10 Text Interpretation: Normal sinus rhythm Minimal voltage criteria for LVH, may be normal variant ( R in aVL ) Nonspecific T wave abnormality Abnormal ECG Confirmed by Orpah Greek [phone]) on 08/13/2020 12:53:12 AM   Radiology DG Chest 2 View  Result Date: 08/13/2020 CLINICAL DATA:  Fall EXAM: CHEST - 2 VIEW COMPARISON:  12/20/2019 FINDINGS: The heart size and mediastinal contours are within normal limits. Both lungs are clear. The visualized skeletal structures are unremarkable. IMPRESSION: No active cardiopulmonary disease. Electronically Signed   By: Donavan Foil M.D.   On: 08/13/2020 00:49   CT HEAD WO CONTRAST  Result Date: 08/13/2020 CLINICAL DATA:  [place] and hit head history of seizure EXAM: CT HEAD WITHOUT CONTRAST TECHNIQUE: Contiguous axial images were obtained from the base of the skull through the vertex without intravenous contrast. COMPARISON:  None. FINDINGS: Brain: No acute territorial infarction or intracranial hemorrhage is visualized. 7 mm cystic area in the right temporal lobe, series 3, image number 23. Ventricles are nonenlarged. Vascular: No hyperdense vessels.  No unexpected calcification Skull: No acute  fracture Sinuses/Orbits: No acute abnormality. Probable scarring over the right forehead. Abnormal appearing right orbit with sent in appearance of right globe, the globe is otherwise intact. Other: None IMPRESSION: 1. No CT evidence for acute intracranial abnormality. 2. Possible 7 mm cyst or cystic lesion in the right temporal lobe, nonemergent MRI is suggested for further evaluation. 3. Abnormal appearing right orbit, reportedly chronic Electronically Signed   By: Donavan Foil M.D.   On: 08/13/2020 00:33   CT Cervical Spine Wo Contrast  Result Date: 08/13/2020 CLINICAL DATA:  Dizziness, fell, hit head EXAM: CT CERVICAL SPINE WITHOUT CONTRAST TECHNIQUE: Multidetector CT imaging of the cervical spine was performed without intravenous contrast. Multiplanar CT image reconstructions were also generated. COMPARISON:  None. FINDINGS: Alignment: Alignment is anatomic. Skull base and vertebrae: No acute displaced fracture. Soft tissues and spinal canal: No prevertebral fluid or swelling. No visible canal hematoma. Disc levels: Mild spondylosis at C5-6 and C6-7. Right-sided neural foraminal encroachment at C5-6. Upper chest: Airway is patent.  Lung apices are clear. Other: Reconstructed images demonstrate no additional findings. IMPRESSION: 1. No acute cervical spine fracture. 2. Lower cervical spondylosis greatest at C5-6. Electronically Signed   By: Randa Ngo M.D.   On: 08/13/2020 00:39   CT Maxillofacial Wo Contrast  Result Date: 08/13/2020 CLINICAL DATA:  Dizziness, fell, hit head, right supraorbital laceration EXAM: CT MAXILLOFACIAL WITHOUT CONTRAST TECHNIQUE: Multidetector CT imaging of the maxillofacial structures was performed. Multiplanar CT image reconstructions were also generated. COMPARISON:  None. FINDINGS: Osseous: No fracture or mandibular dislocation. No destructive process. 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 No acute orbital trauma. Sinuses: Clear.  Soft tissues: Mild right infraorbital soft tissue swelling. Remaining soft tissues are unremarkable. Limited intracranial: No significant or unexpected finding. IMPRESSION: 1. Chronic deformity of the right orbit. 2. Minimal right infraorbital soft tissue swelling. No acute fracture. Electronically Signed   By: Randa Ngo M.D.   On: 08/13/2020 00:42    Procedures Procedures (including critical care time)  Medications Ordered in ED Medications  lidocaine-EPINEPHrine (XYLOCAINE W/EPI) 1 %-1:100000 (with pres) injection 10 mL (has no administration in time range)  cefTRIAXone (ROCEPHIN) 1 g in sodium chloride 0.9 % 100 mL IVPB (1 g  Intravenous New Bag/Given 08/13/20 4268)    ED Course  I have reviewed the triage vital signs and the nursing notes.  Pertinent labs & imaging results that were available during my care of the patient were reviewed by me and considered in my medical decision making (see chart for details).  Clinical Course as of Aug 13 644  Fri Aug 13, 2020  3419 Noted - will give rocephin  Nitrite(!): POSITIVE [HM]  [phone] Nitrite(!): POSITIVE [CG]  9798 Multiple episodes of syncope last few days. Limited history per patient not forthcoming with history, not obviously confused. Unable to contact husband.  If multiple seizures in one day, admit. Obtain more history from husband.   [CG]  [phone] Plan to talk to husband, if recurrent seizures admit. If recurrent syncope also admit.    [CG]  [id number] Med changes recently per neurology    [CG]    Clinical Course User Index [CG] Kinnie Feil, PA-C [HM] Antavious Spanos, Gwenlyn Perking   MDM Rules/Calculators/A&P                           Patient presents after recurrent syncope.  Laceration to the right side of the face.  Patient unable to provide much information.  Work-up continues to pend.  Will discuss with patient's spouse for additional information.  Concern for seizure vs syncope. CT pending.  1:00 AM Ct head/neck/face  without acute hemorrhage or fracture.  Changes from scleroderma noted to be chronic.  75mm cyst or cystic lesion appears to be in the same location as her known cavernous angioma.  I personally evaluated these images.  No shift noted.  Labs reassuring.  Anemia improved.   3:09 AM Left message for husband to return call. Pt sleeping.  5:49 AM Left message for husband to return call.  Laceration repaired by PA-C Sponseller.  Pt tolerated without complication.  6:38 AM Unable to reach husband.  Pt still does not provide additional history.  Concern for breakthrough, recurrent seizures given likely UTI and falls.  Pt will need admission.    At shift change care was transferred to [facility] who will discuss with hospitalist for admission and discuss with husband if he calls back.   Final Clinical Impression(s) / ED Diagnoses Final diagnoses:  Fall, initial encounter  Facial laceration, initial encounter  Seizure [facility])    Rx / DC Orders ED Discharge Orders    None       Gioia Ranes, Gwenlyn Perking 08/13/20 9417    Orpah Greek, MD 08/13/20 [phone]

## 2020-08-14 ENCOUNTER — Telehealth: Payer: Self-pay

## 2020-08-14 ENCOUNTER — Telehealth: Payer: Self-pay | Admitting: Surgery

## 2020-08-14 NOTE — Telephone Encounter (Signed)
Called patient again to see if interested inhome health, no answer on home phone, cell phone could not leave message.

## 2020-08-14 NOTE — Telephone Encounter (Signed)
Called to set up [facility] and wheelchair per orders. Patient did not pick up the phone, mother in law answered, said to call back in a little while"maybe she will answer the phone"

## 2020-08-14 NOTE — Telephone Encounter (Signed)
ED CM contacted patient to discuss recommendations for [facility] services and DME.  Patient declined HH and w/c states she will follow up with her PCP. NO further ED CM needs identified

## 2020-08-15 LAB — URINE CULTURE: Culture: 50000 — AB

## 2020-08-16 ENCOUNTER — Telehealth: Payer: Self-pay | Admitting: *Deleted

## 2020-08-16 LAB — LAMOTRIGINE LEVEL: Lamotrigine Lvl: 4.9 ug/mL (ref 2.0–20.0)

## 2020-08-16 NOTE — Telephone Encounter (Signed)
Post ED Visit - Positive Culture Follow-up: Unsuccessful Patient Follow-up  Culture assessed and recommendations reviewed by:  []  Elenor Quinones, Pharm.D. []  Heide Guile, Pharm.D., BCPS AQ-ID []  Parks Neptune, Pharm.D., BCPS []  Alycia Rossetti, Pharm.D., BCPS []  Adams, Pharm.D., BCPS, AAHIVP []  Legrand Como, Pharm.D., BCPS, AAHIVP []  Wynell Balloon, PharmD []  Vincenza Hews, PharmD, BCPS  Positive urine culture  []  Patient discharged without antimicrobial prescription and treatment is now indicated []  Organism is resistant to prescribed ED discharge antimicrobial []  Patient with positive blood cultures  Plan:  Symptom check, if symptoms worse, D/C Keflex, start Macrobid 100mg  BIC x 5 days  Unable to contact patient after 3 attempts, letter will be sent to address on file  Ardeen Fillers 08/16/2020, 9:40 AM

## 2020-08-16 NOTE — Progress Notes (Signed)
ED Antimicrobial Stewardship Positive Culture Follow Up   Joanna Nelson is an 39 y.o. female who presented to [facility] on 08/12/2020 with a chief complaint of falls, dizziness Chief Complaint  Patient presents with  . Dizziness  . Fall    Recent Results (from the past 720 hour(s))  SARS Coronavirus 2 by RT PCR (hospital order, performed in [facility] hospital lab) Nasopharyngeal Nasopharyngeal Swab     Status: None   Collection Time: 08/13/20  5:45 AM   Specimen: Nasopharyngeal Swab  Result Value Ref Range Status   SARS Coronavirus 2 NEGATIVE NEGATIVE Final    Comment: (NOTE) SARS-CoV-2 target nucleic acids are NOT DETECTED.  The SARS-CoV-2 RNA is generally detectable in upper and lower respiratory specimens during the acute phase of infection. The lowest concentration of SARS-CoV-2 viral copies this assay can detect is 250 copies / mL. A negative result does not preclude SARS-CoV-2 infection and should not be used as the sole basis for treatment or other patient management decisions.  A negative result may occur with improper specimen collection / handling, submission of specimen other than nasopharyngeal swab, presence of viral mutation(s) within the areas targeted by this assay, and inadequate number of viral copies (<250 copies / mL). A negative result must be combined with clinical observations, patient history, and epidemiological information.  Fact Sheet for Patients:   StrictlyIdeas.no  Fact Sheet for Healthcare Providers: BankingDealers.co.za  This test is not yet approved or  cleared by the [place] FDA and has been authorized for detection and/or diagnosis of SARS-CoV-2 by FDA under an Emergency Use Authorization (EUA).  This EUA will remain in effect (meaning this test can be used) for the duration of the COVID-19 declaration under Section 564(b)(1) of the Act, 21 U.S.C. section 360bbb-3(b)(1), unless the  authorization is terminated or revoked sooner.  Performed at [facility], [place] [address], [place], [place] [address]   Urine culture     Status: Abnormal   Collection Time: 08/13/20  4:36 PM   Specimen: Urine, Random  Result Value Ref Range Status   Specimen Description URINE, RANDOM  Final   Special Requests   Final    NONE Performed at [facility], [place] [address], [place], [place] [address]    Culture (A)  Final    50,000 COLONIES/mL ESCHERICHIA COLI >=100,000 COLONIES/mL STAPHYLOCOCCUS EPIDERMIDIS    Report Status 08/15/2020 FINAL  Final   Organism ID, Bacteria ESCHERICHIA COLI (A)  Final   Organism ID, Bacteria STAPHYLOCOCCUS EPIDERMIDIS (A)  Final      Susceptibility   Escherichia coli - MIC*    AMPICILLIN 4 SENSITIVE Sensitive     CEFAZOLIN <=4 SENSITIVE Sensitive     CEFTRIAXONE <=0.25 SENSITIVE Sensitive     CIPROFLOXACIN <=0.25 SENSITIVE Sensitive     GENTAMICIN <=1 SENSITIVE Sensitive     IMIPENEM <=0.25 SENSITIVE Sensitive     NITROFURANTOIN <=16 SENSITIVE Sensitive     TRIMETH/SULFA <=20 SENSITIVE Sensitive     AMPICILLIN/SULBACTAM <=2 SENSITIVE Sensitive     PIP/TAZO <=4 SENSITIVE Sensitive     * 50,000 COLONIES/mL ESCHERICHIA COLI   Staphylococcus epidermidis - MIC*    CIPROFLOXACIN INTERMEDIATE Intermediate     GENTAMICIN <=0.5 SENSITIVE Sensitive     NITROFURANTOIN <=16 SENSITIVE Sensitive     OXACILLIN >=4 RESISTANT Resistant     TETRACYCLINE 4 SENSITIVE Sensitive     VANCOMYCIN <=0.5 SENSITIVE Sensitive     TRIMETH/SULFA 80 RESISTANT Resistant  CLINDAMYCIN >=8 RESISTANT Resistant     RIFAMPIN <=0.5 SENSITIVE Sensitive     Inducible Clindamycin NEGATIVE Sensitive     * >=100,000 COLONIES/mL STAPHYLOCOCCUS EPIDERMIDIS    Plan: symptom check, and if symptoms are worse d/c Keflex and start Macrobid 100 mg BID x 5d  ED Provider: Carmon Sails, PA-C   Joanna Nelson 08/16/2020, 12:58 PM Clinical Pharmacist Monday - Friday phone -   [phone] Saturday - Sunday phone - [phone]

## 2020-08-22 NOTE — Telephone Encounter (Signed)
See notes

## 2020-08-31 ENCOUNTER — Emergency Department
Admission: EM | Admit: 2020-08-31 | Discharge: 2020-08-31 | Disposition: A | Discharge status: Home / Self Care | Payer: 59 | Attending: Emergency Medicine | Admitting: Emergency Medicine

## 2020-08-31 ENCOUNTER — Other Ambulatory Visit: Payer: Self-pay

## 2020-08-31 DIAGNOSIS — Z4802 Encounter for removal of sutures: Secondary | ICD-10-CM | POA: Diagnosis present

## 2020-08-31 DIAGNOSIS — Z8616 Personal history of COVID-19: Secondary | ICD-10-CM | POA: Diagnosis not present

## 2020-08-31 DIAGNOSIS — Z79899 Other long term (current) drug therapy: Secondary | ICD-10-CM | POA: Insufficient documentation

## 2020-08-31 DIAGNOSIS — F17210 Nicotine dependence, cigarettes, uncomplicated: Secondary | ICD-10-CM | POA: Diagnosis not present

## 2020-08-31 NOTE — ED Triage Notes (Signed)
Pt is here for suture removal of right brow.

## 2020-08-31 NOTE — Discharge Instructions (Signed)
Please return for any concern for infection or worsening. You may shower and bathe normally.

## 2020-08-31 NOTE — ED Provider Notes (Signed)
[facility] Emergency Department Provider Note  ____________________________________________   First MD Initiated Contact with Patient 08/31/20 1451     (approximate)  I have reviewed the triage vital signs and the nursing notes.   HISTORY  Chief Complaint Suture / Staple Removal  HPI Joanna Nelson is a 39 y.o. female who reports to the emergency department for evaluation of removal of sutures.  The sutures were placed on 08/12/20 -3 weeks ago.  The sutures were placed at the [facility] emergency room.  She has had no complications from the wound.  She has not noticed any surrounding erythema.  She is without any other complaints at this time.         Past Medical History:  Diagnosis Date  . Epilepsy ([place])   . Seizures [facility])     Patient Active Problem List   Diagnosis Date Noted  . Pneumonia due to COVID-19 virus 12/21/2019  . Acute respiratory failure with hypoxia ([place]) 12/21/2019  . Acute pharyngitis 12/21/2019  . Sepsis ([place]) 12/21/2019  . Dysphagia 12/21/2019  . 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 No pertinent surgical history.  Prior to Admission medications   Medication Sig Start Date End Date Taking? Authorizing Provider  ascorbic acid (VITAMIN C) 500 MG tablet Take 1 tablet (500 mg total) by mouth daily. 12/26/19   Lavina Hamman, MD  cephALEXin (KEFLEX) 500 MG capsule Take 1 capsule (500 mg total) by mouth 4 (four) times daily. 08/13/20   Kinnie Feil, PA-C  cloBAZam (ONFI) 10 MG tablet Take 30 mg by mouth at bedtime. 08/24/19   [provider]  divalproex (DEPAKOTE ER) 500 MG 24 hr tablet Take 500 mg by mouth at bedtime. 11/25/19   [provider]  folic acid (FOLVITE) 1 MG tablet Take 1 tablet (1 mg total) by mouth daily. 12/26/19   Lavina Hamman, MD  lamoTRIgine (LAMICTAL) 150 MG tablet Take 150 mg by mouth daily. 12/12/19    [provider]  pantoprazole (PROTONIX) 40 MG tablet Take 1 tablet (40 mg total) by mouth daily for 14 days. 12/25/19 01/08/20  Lavina Hamman, MD  zinc sulfate 220 (50 Zn) MG capsule Take 1 capsule (220 mg total) by mouth daily. 12/26/19   Lavina Hamman, MD    Allergies Patient has no known allergies.  No family history on file.  Social History Social History   Tobacco Use  . Smoking status: Current Every Day Smoker    Types: Cigarettes  . Smokeless tobacco: Never Used  Substance Use Topics  . Alcohol use: Yes  . Drug use: Never    Review of Systems Constitutional: No fever/chills Eyes: No visual changes. ENT: No sore throat. Cardiovascular: Denies chest pain. Respiratory: Denies shortness of breath. Gastrointestinal: No abdominal pain.  No nausea, no vomiting.  No diarrhea.  No constipation. Genitourinary: Negative for dysuria. Musculoskeletal: Negative for back pain. Skin: + 2 sutures right eyebrow, negative for rash. Neurological: Negative for headaches, focal weakness or numbness.   ____________________________________________   PHYSICAL EXAM:  VITAL SIGNS: ED Triage Vitals  Enc Vitals Group     BP 08/31/20 1330 132/79     Pulse Rate 08/31/20 1330 80     Resp 08/31/20 1330 16     Temp 08/31/20 1330 98.6 F (37 C)     Temp Source 08/31/20 1330 Oral     SpO2 08/31/20 1330 99 %  Weight 08/31/20 1331 135 lb (61.2 kg)     Height 08/31/20 1331 5\' 2"  (1.575 m)     Head Circumference --      Peak Flow --      Pain Score 08/31/20 1331 0     Pain Loc --      Pain Edu? --      Excl. in [place]? --     Constitutional: Alert and oriented. Well appearing and in no acute distress. Eyes: Conjunctivae are normal.  Nose: No congestion/rhinnorhea. Mouth/Throat: Mucous membranes are moist.  Oropharynx non-erythematous. Neck: No stridor. Neurologic:  Normal speech and language. No gross focal neurologic deficits are appreciated. No gait instability. Skin:  There are 2 sutures with scab formation of the right eyebrow.  No surrounding erythema or streaking.  The wound is well healed without any opening.  Skin is warm, dry and intact. No rash noted. Psychiatric: Mood and affect are normal. Speech and behavior are normal.  ____________________________________________   PROCEDURES  Procedure(s) performed (including Critical Care):  .Suture Removal  Date/Time: 08/31/2020 6:35 PM Performed by: Marlana Salvage, PA Authorized by: Marlana Salvage, PA   Consent:    Consent obtained:  Verbal   Consent given by:  Patient   Risks discussed:  Pain Location:    Location:  Head/neck   Head/neck location:  Eyebrow   Eyebrow location:  R eyebrow Procedure details:    Wound appearance:  No signs of infection, good wound healing and clean   Number of sutures removed:  2 Post-procedure details:    Post-removal:  No dressing applied   Patient tolerance of procedure:  Tolerated well, no immediate complications     ____________________________________________   INITIAL IMPRESSION / ASSESSMENT AND PLAN / ED COURSE  As part of my medical decision making, I reviewed the following data within the [place] notes reviewed and incorporated        A Joanna Nelson is a 39 year old female who presents to the emergency department for suture removal.  She had 2 sutures placed in her right eyebrow 3 weeks ago at the [facility] emergency department.  The wound is now well-healed with no opening or surrounding cellulitis.  There is scab formation the sutures.  The sutures were removed without complication and the scab came off with the 2nd suture.  The skin is a healthy pink without any opening.  The patient will follow up with primary care should she have any ongoing complications or return to the emergency department for any worsening.      ____________________________________________   FINAL CLINICAL IMPRESSION(S) / ED  DIAGNOSES  Final diagnoses:  Visit for suture removal     ED Discharge Orders    None      *Please note:  Shakayla Hickox was evaluated in Emergency Department on 08/31/2020 for the symptoms described in the history of present illness. She was evaluated in the context of the global COVID-19 pandemic, which necessitated consideration that the patient might be at risk for infection with the SARS-CoV-2 virus that causes COVID-19. Institutional protocols and algorithms that pertain to the evaluation of patients at risk for COVID-19 are in a state of rapid change based on information released by regulatory bodies including the CDC and federal and state organizations. These policies and algorithms were followed during the patient's care in the ED.  Some ED evaluations and interventions may be delayed as a result of limited staffing during and the pandemic.*  Note:  This document was prepared using Dragon voice recognition software and may include unintentional dictation errors.    Marlana Salvage, PA 08/31/20 1840    Harvest Dark, MD 09/01/20 1525

## 2020-12-04 ENCOUNTER — Emergency Department
Admission: EM | Admit: 2020-12-04 | Discharge: 2020-12-04 | Disposition: A | Discharge status: Home / Self Care | Payer: 59 | Attending: Emergency Medicine | Admitting: Emergency Medicine

## 2020-12-04 ENCOUNTER — Other Ambulatory Visit: Payer: Self-pay

## 2020-12-04 ENCOUNTER — Encounter: Payer: Self-pay | Admitting: Emergency Medicine

## 2020-12-04 DIAGNOSIS — R509 Fever, unspecified: Secondary | ICD-10-CM | POA: Diagnosis not present

## 2020-12-04 DIAGNOSIS — M791 Myalgia, unspecified site: Secondary | ICD-10-CM | POA: Insufficient documentation

## 2020-12-04 DIAGNOSIS — Z5321 Procedure and treatment not carried out due to patient leaving prior to being seen by health care provider: Secondary | ICD-10-CM | POA: Insufficient documentation

## 2020-12-04 NOTE — ED Triage Notes (Signed)
Pt reports fever and bodyaches that started today

## 2021-07-08 IMAGING — MR MR HEAD WO/W CM
14 of 16 series · 40 of 48 positions shown · IV contrast (gadavist)
Comparison: Head CT same day.

CLINICAL DATA: Dizziness and ataxia. Fall with trauma to the head.
History of seizures. Right temporal cyst shown by head CT.

EXAM:
MRI HEAD WITHOUT AND WITH CONTRAST
TECHNIQUE: Multiplanar, multiecho pulse sequences of the brain and surrounding
structures were obtained without and with intravenous contrast.
CONTRAST:  7mL GADAVIST GADOBUTROL 1 MMOL/ML IV SOLN

[Series 5: DWI · axial · 3.0mm · 0.88mm/px · z∈[-161,-10]mm · 5 of 104 slices shown (1 of 4)]
[im 1/104]
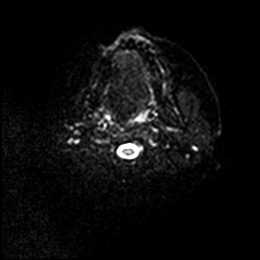
[im 26/104]
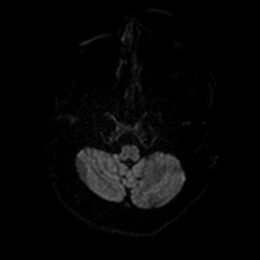
[im 52/104]
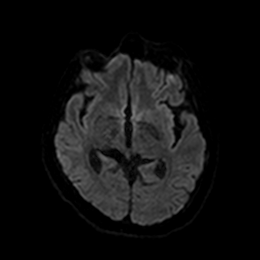
[im 78/104]
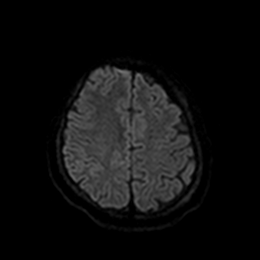
[im 104/104]
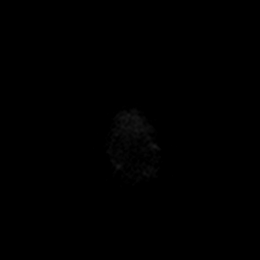

[Series 6: DWI · axial · 3.0mm · 0.88mm/px · z∈[-161,-10]mm · 3 of 52 slices shown (2 of 4)]
[im 1/52]
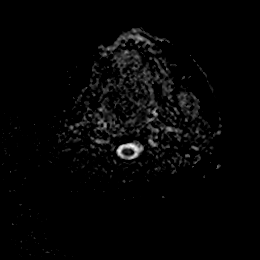
[im 26/52]
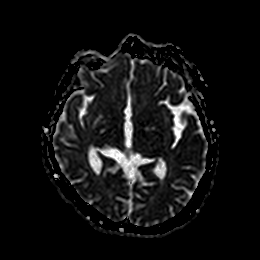
[im 52/52]
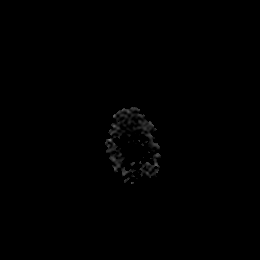

[Series 7: DWI · coronal · 4.0mm · 0.88mm/px · 4 of 68 slices shown (3 of 4)]
[im 1/68]
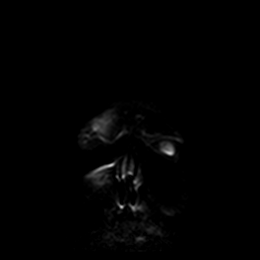
[im 23/68]
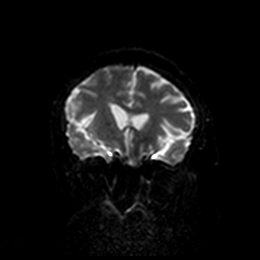
[im 45/68]
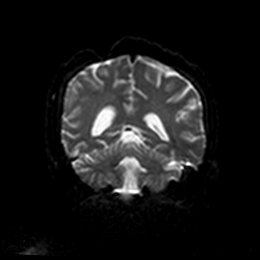
[im 68/68]
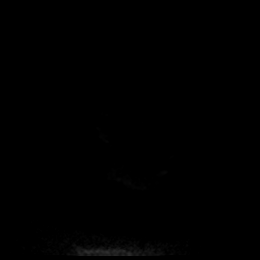

[Series 8: DWI · coronal · 4.0mm · 0.88mm/px · 2 of 34 slices shown (4 of 4)]
[im 1/34]
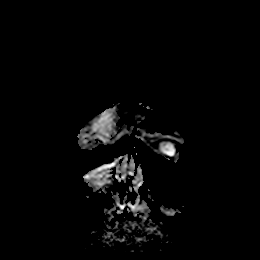
[im 34/34]
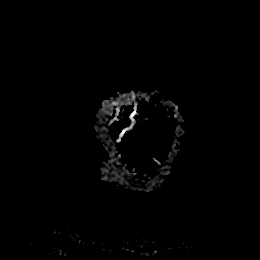

[Series 9: T1 · sagittal · 5.0mm · 0.75mm/px · 1 of 23 slices shown]
[im 1/23]
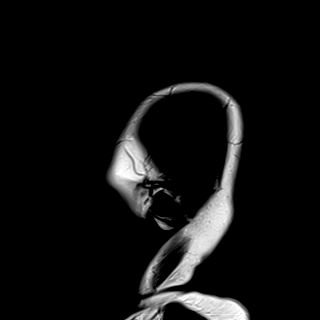

[Series 10: T2 · axial · 5.0mm · 0.72mm/px · z∈[-152,-11]mm · 2 of 25 slices shown (1 of 2)]
[im 1/25]
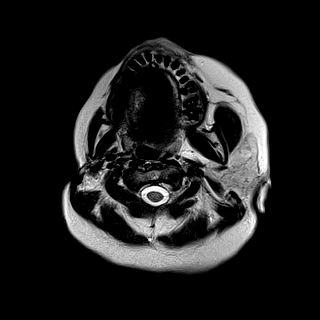
[im 25/25]
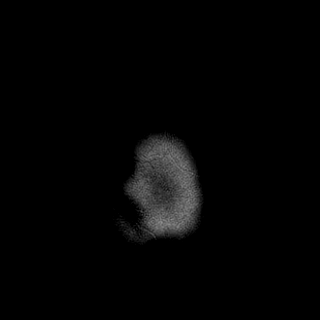

[Series 11: FLAIR · axial · 5.0mm · 0.45mm/px · z∈[-150,-8]mm · 2 of 25 slices shown (1 of 2)]
[im 1/25]
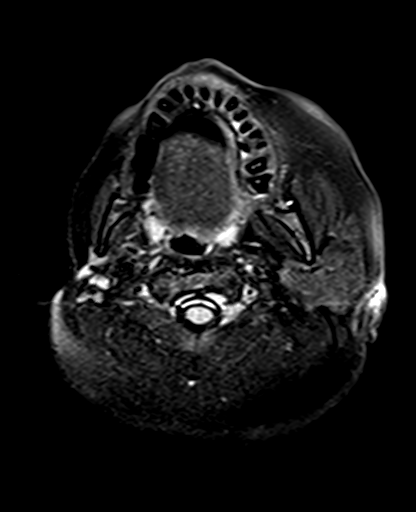
[im 25/25]
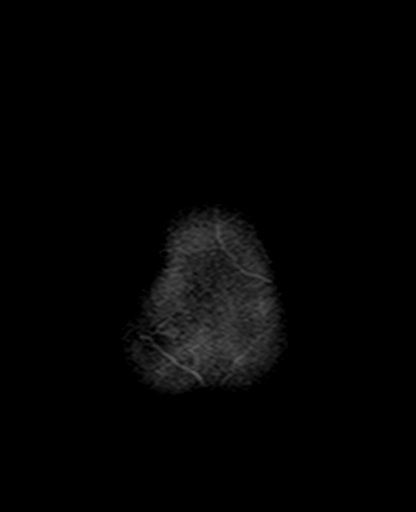

[Series 12: mag_images · axial · 3.0mm · 0.90mm/px · z∈[-164,+10]mm · 4 of 60 slices shown]
[im 1/60]
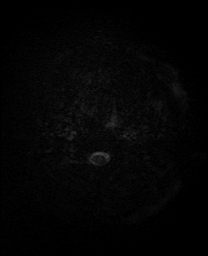
[im 20/60]
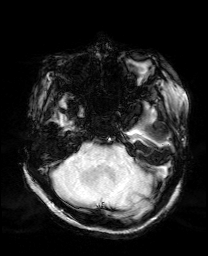
[im 40/60]
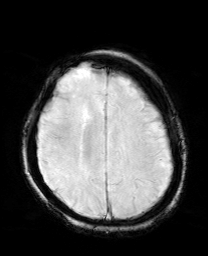
[im 60/60]
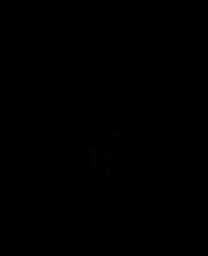

[Series 13: pha_images · axial · 3.0mm · 0.90mm/px · z∈[-164,+4]mm · 4 of 58 slices shown]
[im 1/58]
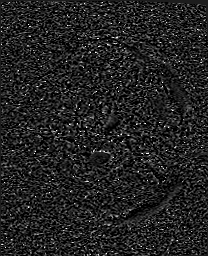
[im 20/58]
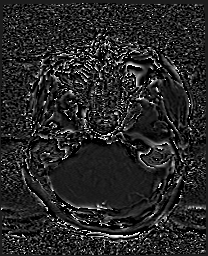
[im 39/58]
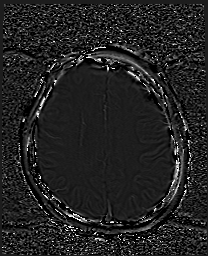
[im 58/58]
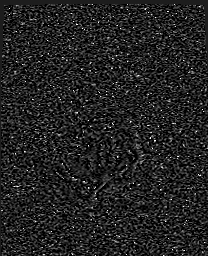

[Series 14: swi_images · axial · 3.0mm · 0.90mm/px · z∈[-164,+10]mm · 4 of 60 slices shown]
[im 1/60]
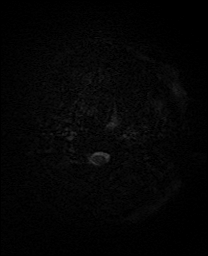
[im 20/60]
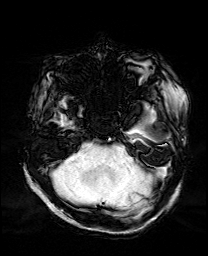
[im 40/60]
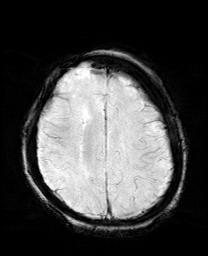
[im 60/60]
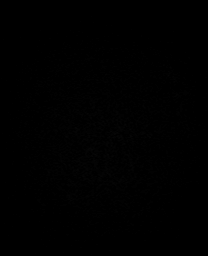

[Series 15: mip_images(sw) · axial · 24.0mm · 0.90mm/px · z∈[-154,+0]mm · 3 of 53 slices shown]
[im 1/53]
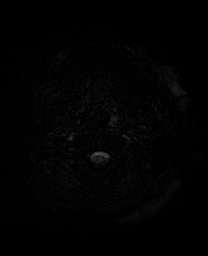
[im 27/53]
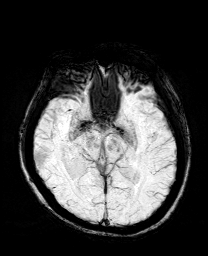
[im 53/53]
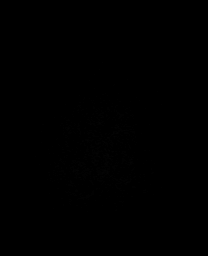

[Series 17: T2 · coronal · 3.0mm · 0.30mm/px · 2 of 36 slices shown (2 of 2)]
[im 1/36]
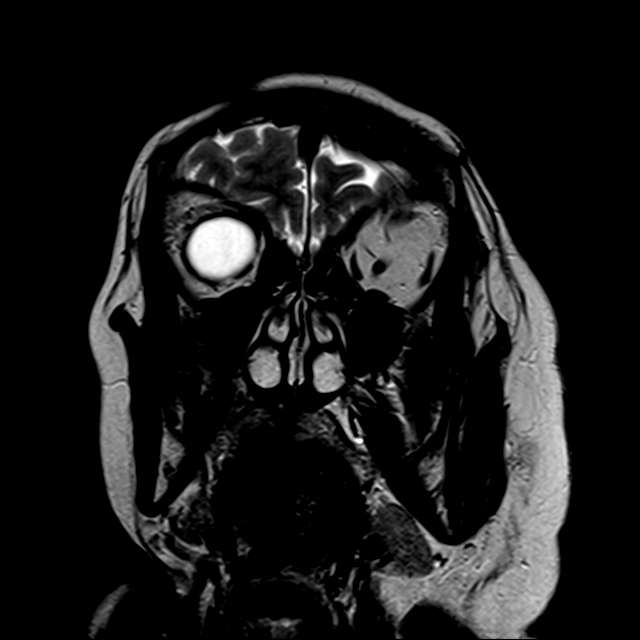
[im 36/36]
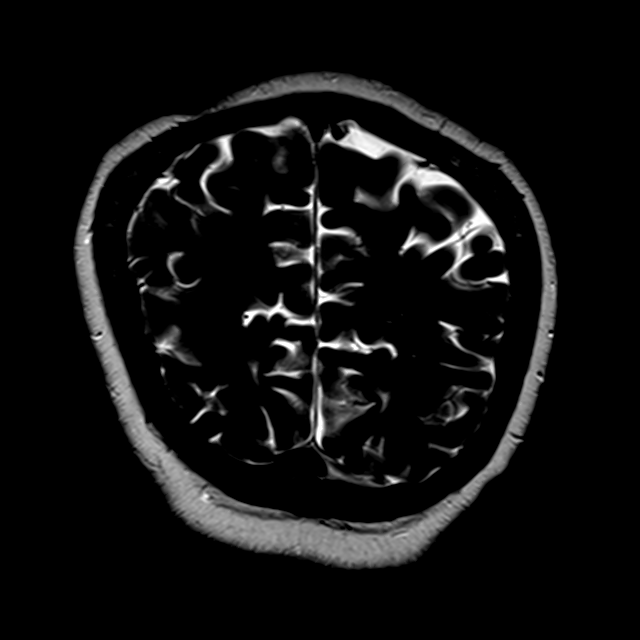

[Series 18: FLAIR · coronal · 3.0mm · 0.59mm/px · 2 of 36 slices shown (2 of 2)]
[im 1/36]
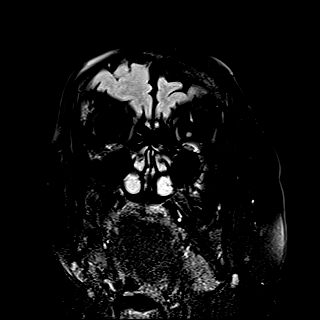
[im 36/36]
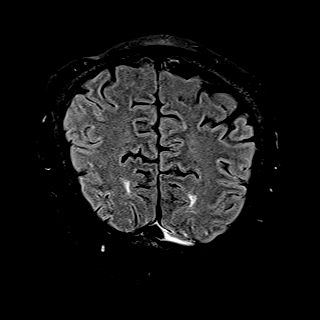

[Series 21: T2 post-contrast · coronal · 5.0mm · 0.72mm/px · 2 of 28 slices shown]
[im 1/28]
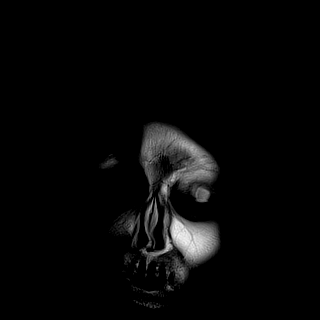
[im 28/28]
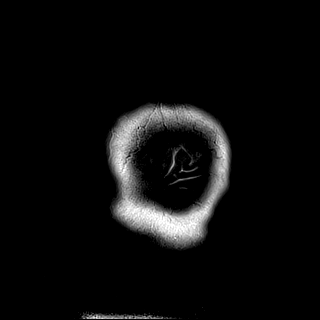

[40 of 48 positions shown; findings below may reference images not displayed]

FINDINGS: Brain: Diffusion imaging does not show any acute or subacute
infarction or other cause of restricted diffusion. The brainstem and
cerebellum are normal. There is a 1 cm lesion at the inferior
temporal lobe on the right with susceptibility artifact, internal
cystic change, and slight contrast enhancement. Most likely
diagnosis is a chronic cavernoma. This could possibly relate to
seizures. No mass effect or edema. Elsewhere, there are scattered
small foci of T2 and FLAIR signal within the hemispheric white
matter. There is some chronic gliosis and volume loss in the right
frontal white matter. No evidence of recent hemorrhage, obstructive
hydrocephalus or extra-axial collection. Right lateral ventricle is
slightly larger than the left in general. Mesial temporal lobes
appear symmetric.

Vascular: Major vessels at the base of the brain show flow.

Skull and upper cervical spine: Thinning of the calvarium and
overlying soft tissues in the right frontal region.

Sinuses/Orbits: Clear sinuses. Endophthalmos on the right. No acute
orbital finding.

Other: None
IMPRESSION: 1. No acute finding by MRI.
2. 1 cm lesion at the inferior temporal lobe on the right with
susceptibility artifact, internal cystic change and slight contrast
enhancement. Most likely diagnosis is a chronic cavernoma. This
could possibly relate to seizures.
3. Mild chronic small-vessel ischemic change of the cerebral
hemispheric white matter. Some chronic gliosis and volume loss in
the right frontal white matter.

## 2021-07-08 IMAGING — CT CT HEAD W/O CM
4 series · 17 of 47 positions shown, 19 images · non-contrast
Comparison: None.

CLINICAL DATA: Fell and hit head history of seizure

EXAM:
CT HEAD WITHOUT CONTRAST
TECHNIQUE: Contiguous axial images were obtained from the base of the skull
through the vertex without intravenous contrast.

[Series 3: head without · axial · non-contrast · 0.38mm/px · z∈[-100,+20]mm · 7 of 32 slices shown, 9 images]
[im 4/32  brain]
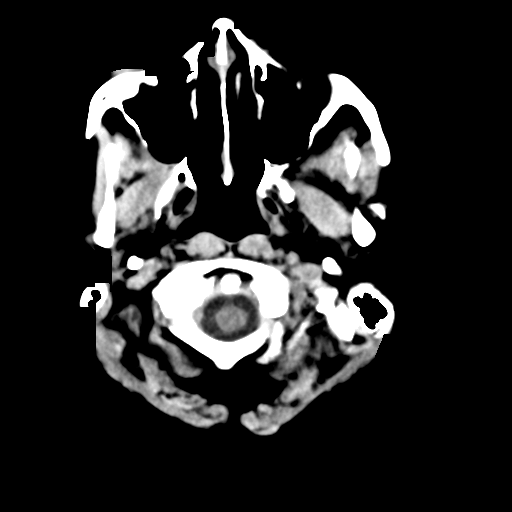
[im 4/32  bone]
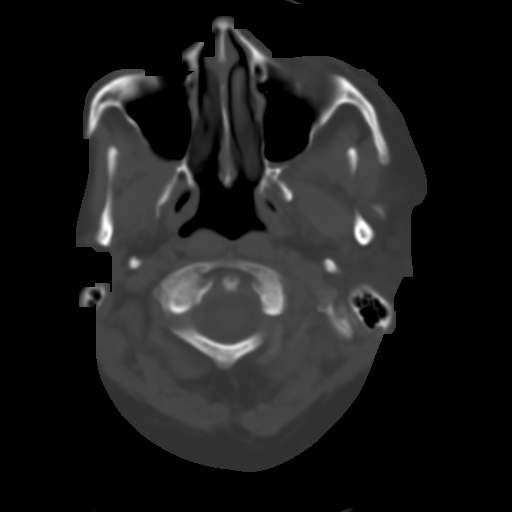
[im 8/32  brain]
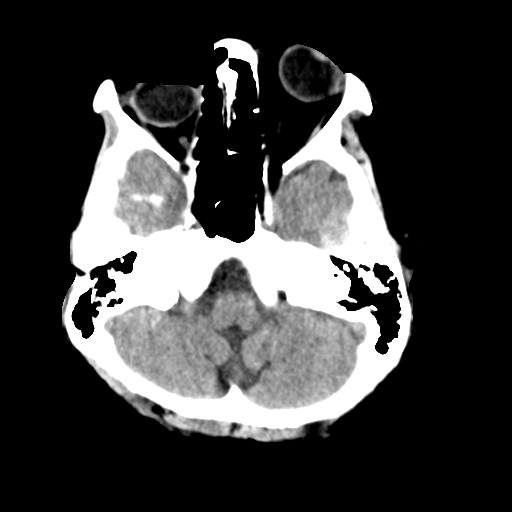
[im 12/32  brain]
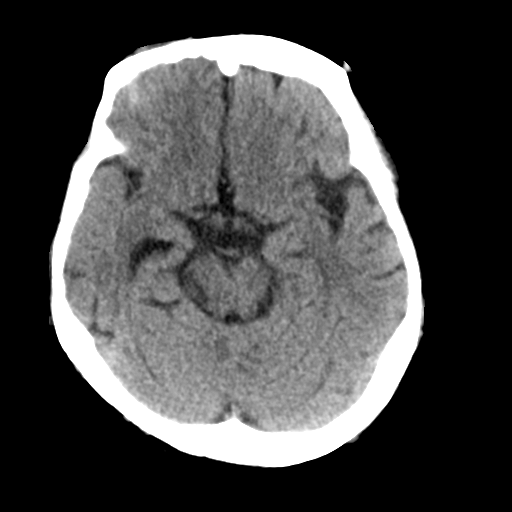
[im 16/32  brain]
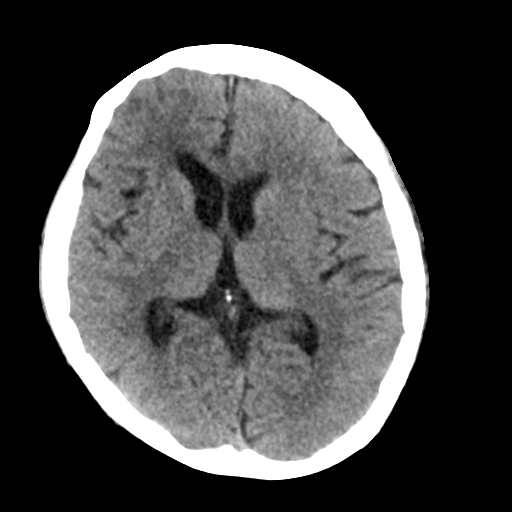
[im 20/32  brain]
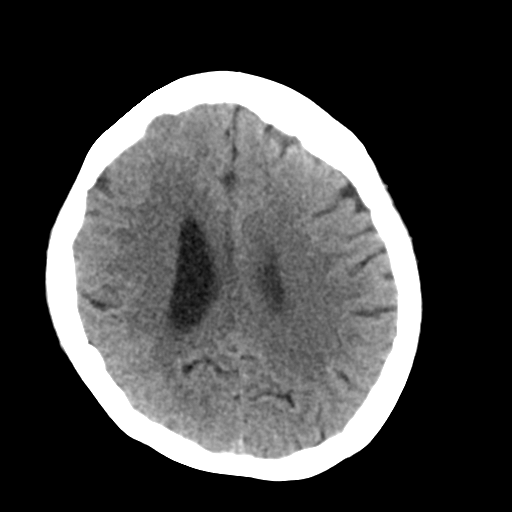
[im 20/32  bone]
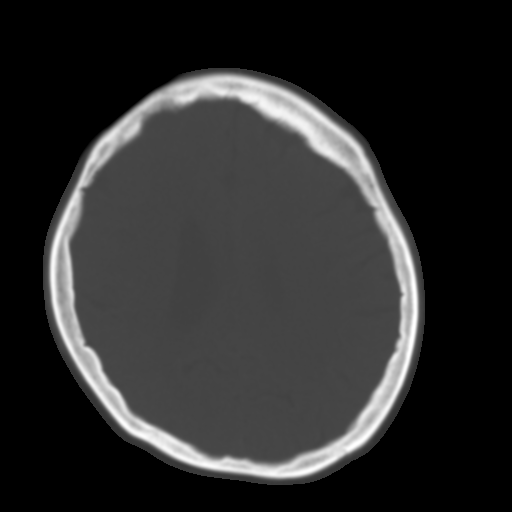
[im 24/32  brain]
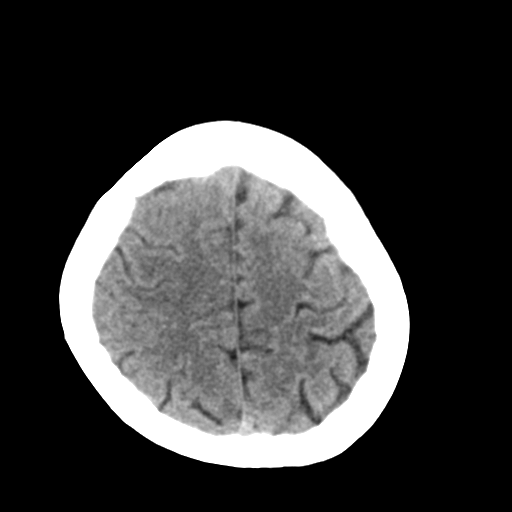
[im 28/32  brain]
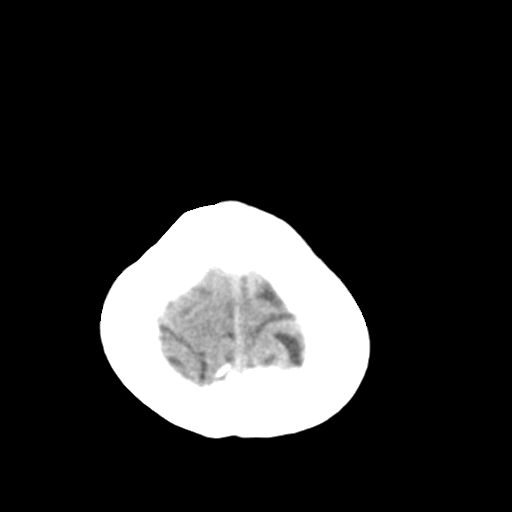

[Series 4: head bone · axial · 0.38mm/px · z∈[-101,-47]mm · 4 of 78 slices shown]
[im 8/78  bone]
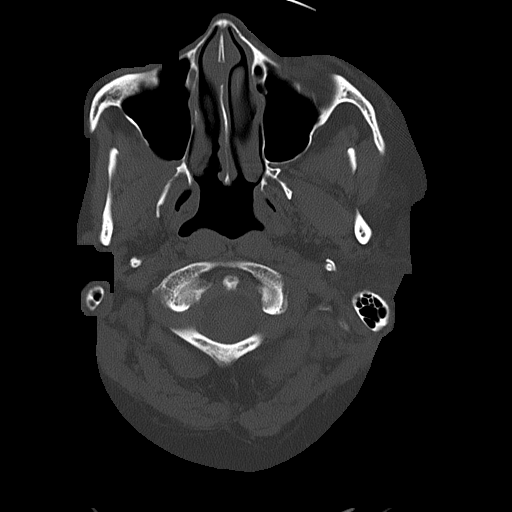
[im 16/78  bone]
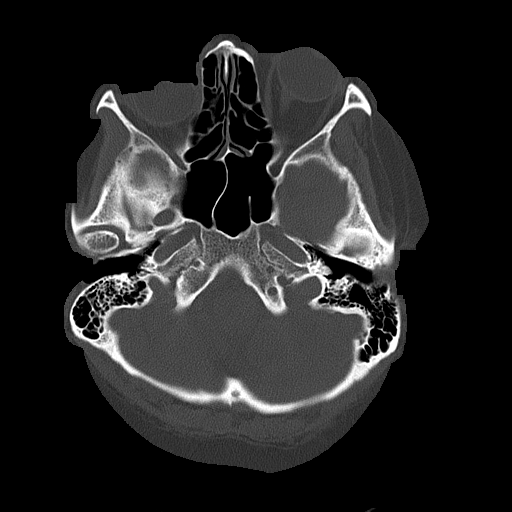
[im 24/78  bone]
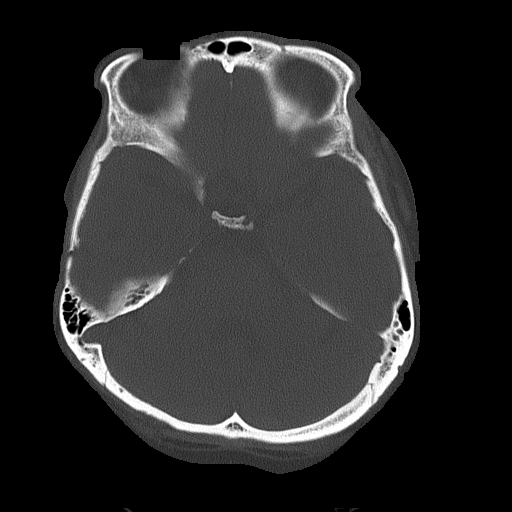
[im 35/78  bone]
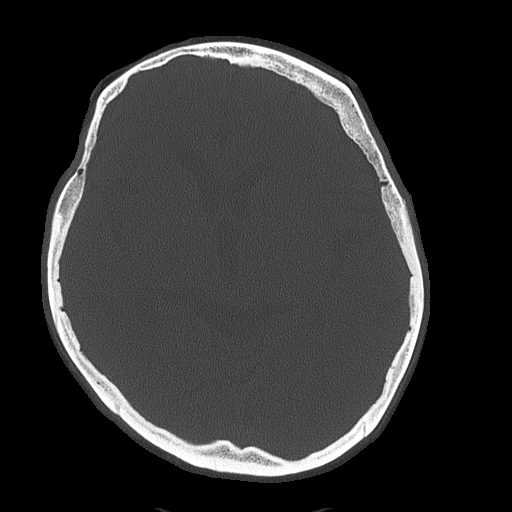

[Series 5: head without cor · coronal · non-contrast · 0.30mm/px · 3 of 61 slices shown]
[im 21/61  brain]
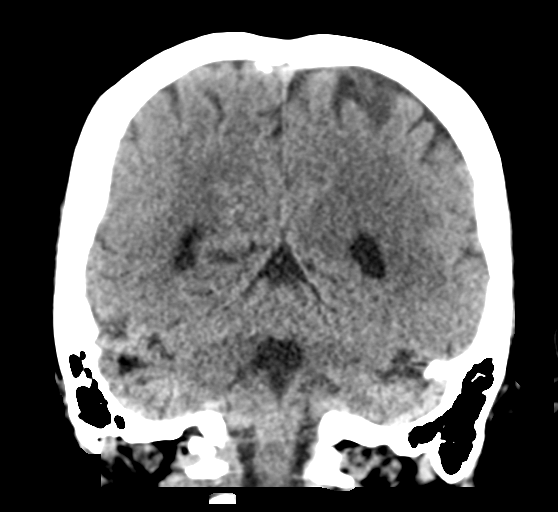
[im 27/61  brain]
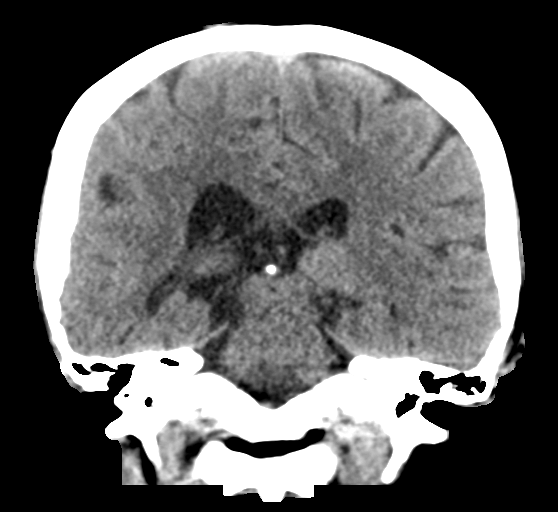
[im 34/61  brain]
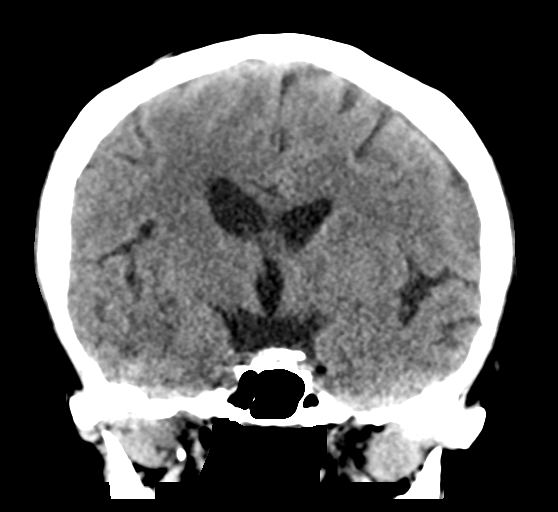

[Series 6: head without sag · sagittal · non-contrast · 0.30mm/px · 3 of 55 slices shown]
[im 19/55  brain]
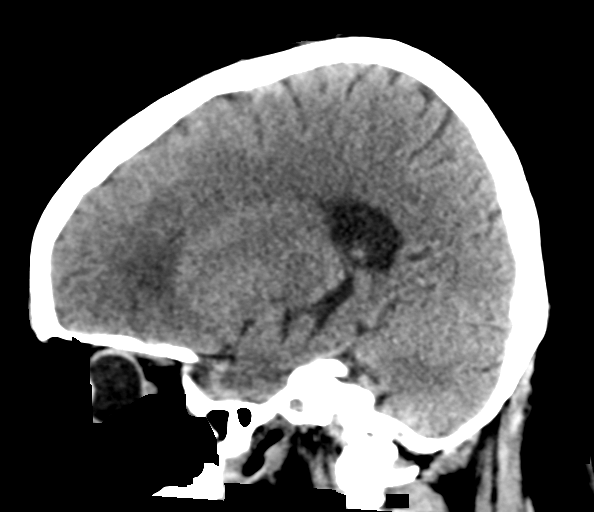
[im 28/55  brain]
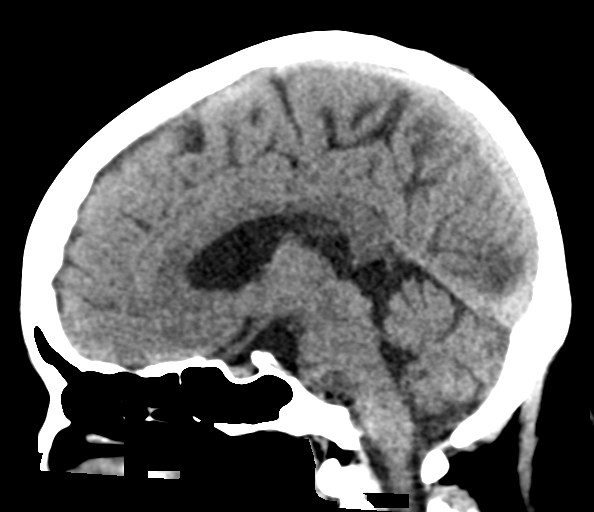
[im 37/55  brain]
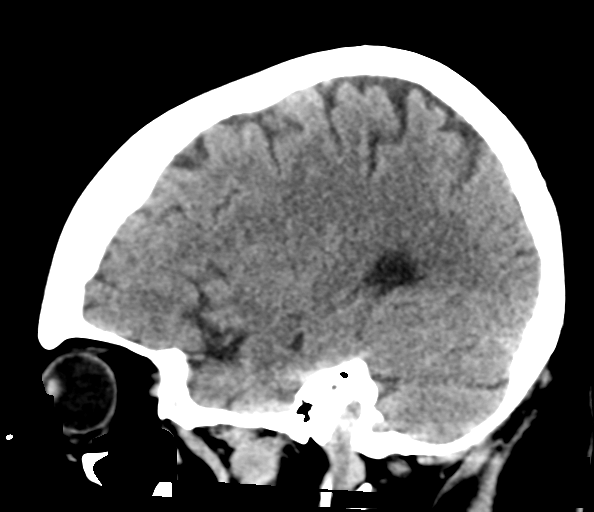

[17 of 47 positions shown; findings below may reference images not displayed]

FINDINGS: Brain: No acute territorial infarction or intracranial hemorrhage is
visualized. 7 mm cystic area in the right temporal lobe, series 3,
image number 23. Ventricles are nonenlarged.

Vascular: No hyperdense vessels.  No unexpected calcification

Skull: No acute fracture

Sinuses/Orbits: No acute abnormality. Probable scarring over the
right forehead. Abnormal appearing right orbit with sent in
appearance of right globe, the globe is otherwise intact.

Other: None
IMPRESSION: 1. No CT evidence for acute intracranial abnormality.
2. Possible 7 mm cyst or cystic lesion in the right temporal lobe,
nonemergent MRI is suggested for further evaluation.
3. Abnormal appearing right orbit, reportedly chronic
